# Patient Record
Sex: Male | Born: 1955
Health system: Southern US, Community
[De-identification: ages and names within clinical notes are randomized; demographics above are authoritative.]

## PROBLEM LIST (undated history)

## (undated) DIAGNOSIS — E785 Hyperlipidemia, unspecified: Secondary | ICD-10-CM

## (undated) DIAGNOSIS — S46019A Strain of muscle(s) and tendon(s) of the rotator cuff of unspecified shoulder, initial encounter: Secondary | ICD-10-CM

## (undated) DIAGNOSIS — K219 Gastro-esophageal reflux disease without esophagitis: Secondary | ICD-10-CM

## (undated) DIAGNOSIS — F419 Anxiety disorder, unspecified: Secondary | ICD-10-CM

## (undated) DIAGNOSIS — I10 Essential (primary) hypertension: Secondary | ICD-10-CM

## (undated) DIAGNOSIS — E119 Type 2 diabetes mellitus without complications: Secondary | ICD-10-CM

## (undated) DIAGNOSIS — R011 Cardiac murmur, unspecified: Secondary | ICD-10-CM

## (undated) DIAGNOSIS — N200 Calculus of kidney: Secondary | ICD-10-CM

## (undated) HISTORY — DX: Hyperlipidemia, unspecified: E78.5

## (undated) HISTORY — PX: EYE SURGERY: SHX253

## (undated) HISTORY — PX: BACK SURGERY: SHX140

## (undated) HISTORY — DX: Cardiac murmur, unspecified: R01.1

---

## 2007-06-29 ENCOUNTER — Encounter: Admission: RE | Admit: 2007-06-29 | Discharge: 2007-06-29 | Payer: Self-pay | Admitting: Interventional Cardiology

## 2007-07-06 ENCOUNTER — Inpatient Hospital Stay (HOSPITAL_BASED_OUTPATIENT_CLINIC_OR_DEPARTMENT_OTHER): Admission: RE | Admit: 2007-07-06 | Discharge: 2007-07-06 | Payer: Self-pay | Admitting: Interventional Cardiology

## 2010-08-24 NOTE — Cardiovascular Report (Signed)
NAMETRAEGER, Bobby NO.:  [id number]   MEDICAL RECORD NO.:  [id number]          PATIENT TYPE:  OIB   LOCATION:  1961                         FACILITY:  [facility]   PHYSICIAN:  [name], M.D.   DATE OF BIRTH:  [birth year]   DATE OF PROCEDURE:  07/06/2007  DATE OF DISCHARGE:  07/06/2007                            CARDIAC CATHETERIZATION   INDICATIONS FOR THE PROCEDURE:  [name] had multiple risk factors  including diabetes.  He is 55 years of age.  He has had intermittent  atypical chest pain.  The procedure is being done after a Cardiolite  study demonstrated reversible perfusion defect in the inferior wall.  Findings include the possibility of soft tissue attenuation artifact  versus true coronary disease.   PROCEDURE PERFORMED:  1. Left heart catheterization.  2. Selective coronary angiography.  3. Left ventriculography by hand injection.   DESCRIPTION:  After informed consent, a 4-French sheath was placed in  the right femoral artery using the modified Seldinger technique.  A 4-  [place] A2 multipurpose catheter was then used for hemodynamic  recordings, left ventriculography by hand injection, and right coronary  angiography.  We also used a #4, 4-French left Judkins catheter for left  coronary angiography.  Patient tolerated the procedure without  complications.   RESULTS:  1. Hemodynamic data:      a.     Aortic pressure 126/71.      b.     Left ventricular pressure 128/17.  2. Left ventriculography:  The left ventricle is apparently opacified      by hand injection.  No  significant wall motion abnormality is      noted.  EF is felt to be normal and at least greater than 50%.  3. Coronary angiography.      a.     Left main coronary:  Patent, widely open.      b.     Left anterior descending coronary:  The LAD is large, no       significant obstruction is noted.  The first diagonal branch       contains luminal irregularities, mid LAD contains  luminal       irregularities, no high-grade obstruction is felt to be present.      c.     Circumflex artery:  Circumflex coronary artery is moderate       size vessel predominantly giving off one obtuse marginal branch,       no high-grade obstructive lesions felt to be present.      d.     Right coronary:  The right coronary artery is a dominant       vessel  that gives origin to large PDA and two left ventricular       branches.  No significant obstruction is seen.   CONCLUSIONS:  1. Minimal luminal irregularities in  left anterior descending and      first diagonal, the coronaries,  however, are widely patent without      any significant obstructive lesions.  2. Normal left ventricular function.  3.  Aortic valve calcification  in the left coronary cusp.   PLAN:  No further cardiac evaluation save  for the patient to exercise,  aggressive risk factor modification in this young patient is necessary  to prevent downstream development of significant obstructive disease  and/or acute ischemic syndromes.      [name], M.D.  Electronically Signed     HWS/MEDQ  D:  07/06/2007  T:  07/06/2007  Job:  [id number]   cc:   Holley Bouche, M.D.

## 2015-03-25 ENCOUNTER — Other Ambulatory Visit: Payer: Self-pay | Admitting: Orthopedic Surgery

## 2015-03-26 ENCOUNTER — Encounter (HOSPITAL_BASED_OUTPATIENT_CLINIC_OR_DEPARTMENT_OTHER): Payer: Self-pay | Admitting: *Deleted

## 2015-03-27 ENCOUNTER — Encounter (HOSPITAL_BASED_OUTPATIENT_CLINIC_OR_DEPARTMENT_OTHER)
Admission: RE | Admit: 2015-03-27 | Discharge: 2015-03-27 | Disposition: A | Discharge status: Home / Self Care | Payer: BLUE CROSS/BLUE SHIELD | Source: Ambulatory Visit | Attending: Orthopedic Surgery | Admitting: Orthopedic Surgery

## 2015-03-27 DIAGNOSIS — Z7984 Long term (current) use of oral hypoglycemic drugs: Secondary | ICD-10-CM | POA: Diagnosis not present

## 2015-03-27 DIAGNOSIS — Z6836 Body mass index (BMI) 36.0-36.9, adult: Secondary | ICD-10-CM | POA: Diagnosis not present

## 2015-03-27 DIAGNOSIS — M25511 Pain in right shoulder: Secondary | ICD-10-CM | POA: Diagnosis present

## 2015-03-27 DIAGNOSIS — M65811 Other synovitis and tenosynovitis, right shoulder: Secondary | ICD-10-CM | POA: Diagnosis not present

## 2015-03-27 DIAGNOSIS — Z79899 Other long term (current) drug therapy: Secondary | ICD-10-CM | POA: Diagnosis not present

## 2015-03-27 DIAGNOSIS — K219 Gastro-esophageal reflux disease without esophagitis: Secondary | ICD-10-CM | POA: Diagnosis not present

## 2015-03-27 DIAGNOSIS — E119 Type 2 diabetes mellitus without complications: Secondary | ICD-10-CM | POA: Diagnosis not present

## 2015-03-27 DIAGNOSIS — M7581 Other shoulder lesions, right shoulder: Secondary | ICD-10-CM | POA: Diagnosis not present

## 2015-03-27 DIAGNOSIS — I10 Essential (primary) hypertension: Secondary | ICD-10-CM | POA: Diagnosis not present

## 2015-03-27 DIAGNOSIS — Z87442 Personal history of urinary calculi: Secondary | ICD-10-CM | POA: Diagnosis not present

## 2015-03-27 LAB — BASIC METABOLIC PANEL
ANION GAP: 12 (ref 5–15)
BUN: 12 mg/dL (ref 6–20)
CHLORIDE: 100 mmol/L — AB (ref 101–111)
CO2: 24 mmol/L (ref 22–32)
Calcium: 9.5 mg/dL (ref 8.9–10.3)
Creatinine, Ser: 1.36 mg/dL — ABNORMAL HIGH (ref 0.61–1.24)
GFR calc Af Amer: >60 mL/min (ref >60)
GFR calc non Af Amer: 56 mL/min — ABNORMAL LOW (ref >60)
GLUCOSE: 157 mg/dL — AB (ref 65–99)
POTASSIUM: 4.7 mmol/L (ref 3.5–5.1)
Sodium: 136 mmol/L (ref 135–145)

## 2015-03-27 LAB — GLUCOSE, CAPILLARY: Glucose-Capillary: 149 mg/dL — ABNORMAL HIGH (ref 65–99)

## 2015-03-30 ENCOUNTER — Ambulatory Visit (HOSPITAL_BASED_OUTPATIENT_CLINIC_OR_DEPARTMENT_OTHER): Payer: BLUE CROSS/BLUE SHIELD | Admitting: Anesthesiology

## 2015-03-30 ENCOUNTER — Encounter (HOSPITAL_BASED_OUTPATIENT_CLINIC_OR_DEPARTMENT_OTHER): Payer: Self-pay

## 2015-03-30 ENCOUNTER — Ambulatory Visit (HOSPITAL_BASED_OUTPATIENT_CLINIC_OR_DEPARTMENT_OTHER)
Admission: RE | Admit: 2015-03-30 | Discharge: 2015-03-30 | Disposition: A | Discharge status: Home / Self Care | Payer: BLUE CROSS/BLUE SHIELD | Source: Ambulatory Visit | Attending: Orthopedic Surgery | Admitting: Orthopedic Surgery

## 2015-03-30 ENCOUNTER — Encounter (HOSPITAL_BASED_OUTPATIENT_CLINIC_OR_DEPARTMENT_OTHER)
Admission: RE | Disposition: A | Discharge status: Home / Self Care | Payer: Self-pay | Source: Ambulatory Visit | Attending: Orthopedic Surgery

## 2015-03-30 DIAGNOSIS — K219 Gastro-esophageal reflux disease without esophagitis: Secondary | ICD-10-CM | POA: Insufficient documentation

## 2015-03-30 DIAGNOSIS — M65811 Other synovitis and tenosynovitis, right shoulder: Secondary | ICD-10-CM | POA: Diagnosis not present

## 2015-03-30 DIAGNOSIS — I10 Essential (primary) hypertension: Secondary | ICD-10-CM | POA: Insufficient documentation

## 2015-03-30 DIAGNOSIS — E119 Type 2 diabetes mellitus without complications: Secondary | ICD-10-CM | POA: Insufficient documentation

## 2015-03-30 DIAGNOSIS — Z6836 Body mass index (BMI) 36.0-36.9, adult: Secondary | ICD-10-CM | POA: Insufficient documentation

## 2015-03-30 DIAGNOSIS — M7581 Other shoulder lesions, right shoulder: Secondary | ICD-10-CM | POA: Insufficient documentation

## 2015-03-30 DIAGNOSIS — Z79899 Other long term (current) drug therapy: Secondary | ICD-10-CM | POA: Insufficient documentation

## 2015-03-30 DIAGNOSIS — Z87442 Personal history of urinary calculi: Secondary | ICD-10-CM | POA: Insufficient documentation

## 2015-03-30 DIAGNOSIS — Z7984 Long term (current) use of oral hypoglycemic drugs: Secondary | ICD-10-CM | POA: Insufficient documentation

## 2015-03-30 HISTORY — DX: Essential (primary) hypertension: I10

## 2015-03-30 HISTORY — PX: SHOULDER ARTHROSCOPY WITH SUBACROMIAL DECOMPRESSION: SHX5684

## 2015-03-30 HISTORY — DX: Type 2 diabetes mellitus without complications: E11.9

## 2015-03-30 HISTORY — DX: Calculus of kidney: N20.0

## 2015-03-30 HISTORY — DX: Anxiety disorder, unspecified: F41.9

## 2015-03-30 HISTORY — DX: Strain of muscle(s) and tendon(s) of the rotator cuff of unspecified shoulder, initial encounter: S46.019A

## 2015-03-30 HISTORY — DX: Gastro-esophageal reflux disease without esophagitis: K21.9

## 2015-03-30 LAB — GLUCOSE, CAPILLARY
GLUCOSE-CAPILLARY: 157 mg/dL — AB (ref 65–99)
GLUCOSE-CAPILLARY: 175 mg/dL — AB (ref 65–99)

## 2015-03-30 SURGERY — SHOULDER ARTHROSCOPY WITH SUBACROMIAL DECOMPRESSION
Anesthesia: General | Site: Shoulder | Laterality: Right

## 2015-03-30 MED ORDER — LACTATED RINGERS IV SOLN
INTRAVENOUS | Status: DC
  Administered 2015-03-30 (×3): via INTRAVENOUS

## 2015-03-30 MED ORDER — POVIDONE-IODINE 7.5 % EX SOLN: Freq: Once | CUTANEOUS | Status: DC

## 2015-03-30 MED ORDER — PROPOFOL 10 MG/ML IV BOLUS
INTRAVENOUS | Status: DC | PRN
  Administered 2015-03-30: 300 mg via INTRAVENOUS

## 2015-03-30 MED ORDER — CEFAZOLIN SODIUM 1-5 GM-% IV SOLN
INTRAVENOUS | Status: AC
  Filled 2015-03-30: qty 50

## 2015-03-30 MED ORDER — KETOROLAC TROMETHAMINE 30 MG/ML IJ SOLN
30.0000 mg | Freq: Once | INTRAMUSCULAR | Status: AC
  Administered 2015-03-30: 30 mg via INTRAVENOUS

## 2015-03-30 MED ORDER — CEFAZOLIN SODIUM-DEXTROSE 2-3 GM-% IV SOLR
2.0000 g | INTRAVENOUS | Status: AC
  Administered 2015-03-30: 3 g via INTRAVENOUS

## 2015-03-30 MED ORDER — SCOPOLAMINE 1 MG/3DAYS TD PT72: 1.0000 | MEDICATED_PATCH | Freq: Once | TRANSDERMAL | Status: DC | PRN

## 2015-03-30 MED ORDER — OXYCODONE HCL 5 MG PO TABS
5.0000 mg | ORAL_TABLET | Freq: Once | ORAL | Status: AC | PRN
  Administered 2015-03-30: 5 mg via ORAL

## 2015-03-30 MED ORDER — ONDANSETRON HCL 4 MG/2ML IJ SOLN
INTRAMUSCULAR | Status: DC | PRN
  Administered 2015-03-30: 4 mg via INTRAVENOUS

## 2015-03-30 MED ORDER — MIDAZOLAM HCL 2 MG/2ML IJ SOLN
INTRAMUSCULAR | Status: AC
  Filled 2015-03-30: qty 2

## 2015-03-30 MED ORDER — FENTANYL CITRATE (PF) 100 MCG/2ML IJ SOLN
INTRAMUSCULAR | Status: AC
  Filled 2015-03-30: qty 2

## 2015-03-30 MED ORDER — CEFAZOLIN SODIUM-DEXTROSE 2-3 GM-% IV SOLR
INTRAVENOUS | Status: AC
  Filled 2015-03-30: qty 100

## 2015-03-30 MED ORDER — GLYCOPYRROLATE 0.2 MG/ML IJ SOLN: 0.2000 mg | Freq: Once | INTRAMUSCULAR | Status: DC | PRN

## 2015-03-30 MED ORDER — HYDROMORPHONE HCL 1 MG/ML IJ SOLN
INTRAMUSCULAR | Status: AC
  Filled 2015-03-30: qty 1

## 2015-03-30 MED ORDER — OXYCODONE HCL 5 MG/5ML PO SOLN: 5.0000 mg | Freq: Once | ORAL | Status: AC | PRN

## 2015-03-30 MED ORDER — DEXAMETHASONE SODIUM PHOSPHATE 4 MG/ML IJ SOLN
INTRAMUSCULAR | Status: DC | PRN
  Administered 2015-03-30: 10 mg via INTRAVENOUS

## 2015-03-30 MED ORDER — DOCUSATE SODIUM 100 MG PO CAPS: 100.0000 mg | ORAL_CAPSULE | Freq: Three times a day (TID) | ORAL | Status: DC | PRN

## 2015-03-30 MED ORDER — FENTANYL CITRATE (PF) 100 MCG/2ML IJ SOLN
50.0000 ug | INTRAMUSCULAR | Status: AC | PRN
  Administered 2015-03-30 (×3): 100 ug via INTRAVENOUS

## 2015-03-30 MED ORDER — HYDROMORPHONE HCL 1 MG/ML IJ SOLN
0.5000 mg | INTRAMUSCULAR | Status: DC | PRN
  Administered 2015-03-30 (×2): 0.5 mg via INTRAVENOUS

## 2015-03-30 MED ORDER — SUCCINYLCHOLINE CHLORIDE 20 MG/ML IJ SOLN
INTRAMUSCULAR | Status: DC | PRN
  Administered 2015-03-30: 120 mg via INTRAVENOUS

## 2015-03-30 MED ORDER — OXYCODONE HCL 5 MG PO TABS
ORAL_TABLET | ORAL | Status: AC
  Filled 2015-03-30: qty 1

## 2015-03-30 MED ORDER — SODIUM CHLORIDE 0.9 % IR SOLN
Status: DC | PRN
  Administered 2015-03-30: 3100 mL

## 2015-03-30 MED ORDER — TRIAMCINOLONE ACETONIDE 40 MG/ML IJ SUSP
INTRAMUSCULAR | Status: DC | PRN
Start: 2015-03-30 — End: 2015-03-30
  Administered 2015-03-30: 40 mg via INTRAMUSCULAR

## 2015-03-30 MED ORDER — OXYCODONE-ACETAMINOPHEN 5-325 MG PO TABS: 1.0000 | ORAL_TABLET | ORAL | Status: DC | PRN

## 2015-03-30 MED ORDER — HYDROMORPHONE HCL 1 MG/ML IJ SOLN
0.2500 mg | INTRAMUSCULAR | Status: DC | PRN
  Administered 2015-03-30 (×4): 0.5 mg via INTRAVENOUS

## 2015-03-30 MED ORDER — HYDROMORPHONE HCL 1 MG/ML IJ SOLN
0.5000 mg | INTRAMUSCULAR | Status: AC | PRN
  Administered 2015-03-30 (×4): 0.5 mg via INTRAVENOUS

## 2015-03-30 MED ORDER — TRIAMCINOLONE ACETONIDE 40 MG/ML IJ SUSP
INTRAMUSCULAR | Status: AC
  Filled 2015-03-30: qty 5

## 2015-03-30 MED ORDER — MIDAZOLAM HCL 2 MG/2ML IJ SOLN
1.0000 mg | INTRAMUSCULAR | Status: DC | PRN
  Administered 2015-03-30: 2 mg via INTRAVENOUS

## 2015-03-30 MED ORDER — BUPIVACAINE HCL (PF) 0.25 % IJ SOLN
INTRAMUSCULAR | Status: DC | PRN
  Administered 2015-03-30: 7 mL

## 2015-03-30 MED ORDER — LIDOCAINE HCL (CARDIAC) 20 MG/ML IV SOLN
INTRAVENOUS | Status: DC | PRN
  Administered 2015-03-30: 50 mg via INTRAVENOUS

## 2015-03-30 MED ORDER — BUPIVACAINE-EPINEPHRINE (PF) 0.5% -1:200000 IJ SOLN
INTRAMUSCULAR | Status: DC | PRN
  Administered 2015-03-30: 25 mL via PERINEURAL

## 2015-03-30 MED ORDER — MEPERIDINE HCL 25 MG/ML IJ SOLN: 6.2500 mg | INTRAMUSCULAR | Status: DC | PRN

## 2015-03-30 MED ORDER — KETOROLAC TROMETHAMINE 30 MG/ML IJ SOLN
INTRAMUSCULAR | Status: AC
  Filled 2015-03-30: qty 1

## 2015-03-30 SURGICAL SUPPLY — 79 items
APL SKNCLS STERI-STRIP NONHPOA (GAUZE/BANDAGES/DRESSINGS)
BENZOIN TINCTURE PRP APPL 2/3 (GAUZE/BANDAGES/DRESSINGS) IMPLANT
BLADE CLIPPER SURG (BLADE) IMPLANT
BLADE SURG 15 STRL LF DISP TIS (BLADE) IMPLANT
BLADE SURG 15 STRL SS (BLADE)
BUR OVAL 4.0 (BURR) ×3 IMPLANT
CANNULA 5.75X71 LONG (CANNULA) ×3 IMPLANT
CANNULA TWIST IN 8.25X7CM (CANNULA) IMPLANT
CHLORAPREP W/TINT 26ML (MISCELLANEOUS) ×3 IMPLANT
DECANTER SPIKE VIAL GLASS SM (MISCELLANEOUS) IMPLANT
DRAPE INCISE IOBAN 66X45 STRL (DRAPES) ×3 IMPLANT
DRAPE STERI 35X30 U-POUCH (DRAPES) ×3 IMPLANT
DRAPE SURG 17X23 STRL (DRAPES) ×3 IMPLANT
DRAPE U 20/CS (DRAPES) ×3 IMPLANT
DRAPE U-SHAPE 47X51 STRL (DRAPES) ×3 IMPLANT
DRAPE U-SHAPE 76X120 STRL (DRAPES) ×6 IMPLANT
DRSG PAD ABDOMINAL 8X10 ST (GAUZE/BANDAGES/DRESSINGS) ×3 IMPLANT
ELECT REM PT RETURN 9FT ADLT (ELECTROSURGICAL) ×3
ELECTRODE REM PT RTRN 9FT ADLT (ELECTROSURGICAL) ×2 IMPLANT
GAUZE SPONGE 4X4 12PLY STRL (GAUZE/BANDAGES/DRESSINGS) ×3 IMPLANT
GAUZE SPONGE 4X4 16PLY XRAY LF (GAUZE/BANDAGES/DRESSINGS) IMPLANT
GAUZE XEROFORM 1X8 LF (GAUZE/BANDAGES/DRESSINGS) ×3 IMPLANT
GLOVE BIO SURGEON STRL SZ 6.5 (GLOVE) ×4 IMPLANT
GLOVE BIO SURGEON STRL SZ7 (GLOVE) ×3 IMPLANT
GLOVE BIO SURGEON STRL SZ7.5 (GLOVE) ×3 IMPLANT
GLOVE BIOGEL PI IND STRL 7.0 (GLOVE) ×2 IMPLANT
GLOVE BIOGEL PI IND STRL 8 (GLOVE) ×2 IMPLANT
GLOVE BIOGEL PI INDICATOR 7.0 (GLOVE) ×1
GLOVE BIOGEL PI INDICATOR 8 (GLOVE) ×1
GOWN STRL REUS W/ TWL LRG LVL3 (GOWN DISPOSABLE) ×4 IMPLANT
GOWN STRL REUS W/ TWL XL LVL3 (GOWN DISPOSABLE) ×2 IMPLANT
GOWN STRL REUS W/TWL LRG LVL3 (GOWN DISPOSABLE) ×6
GOWN STRL REUS W/TWL XL LVL3 (GOWN DISPOSABLE) ×3
LASSO CRESCENT QUICKPASS (SUTURE) IMPLANT
LIQUID BAND (GAUZE/BANDAGES/DRESSINGS) IMPLANT
MANIFOLD NEPTUNE II (INSTRUMENTS) ×3 IMPLANT
NDL 1/2 CIR CATGUT .05X1.09 (NEEDLE) IMPLANT
NDL SCORPION MULTI FIRE (NEEDLE) IMPLANT
NDL SUT 6 .5 CRC .975X.05 MAYO (NEEDLE) IMPLANT
NEEDLE 1/2 CIR CATGUT .05X1.09 (NEEDLE) IMPLANT
NEEDLE MAYO TAPER (NEEDLE)
NEEDLE SCORPION MULTI FIRE (NEEDLE) IMPLANT
NS IRRIG 1000ML POUR BTL (IV SOLUTION) IMPLANT
PACK ARTHROSCOPY DSU (CUSTOM PROCEDURE TRAY) ×3 IMPLANT
PACK BASIN DAY SURGERY FS (CUSTOM PROCEDURE TRAY) ×3 IMPLANT
PENCIL BUTTON HOLSTER BLD 10FT (ELECTRODE) IMPLANT
RESECTOR FULL RADIUS 4.2MM (BLADE) ×3 IMPLANT
SLEEVE SCD COMPRESS KNEE MED (MISCELLANEOUS) ×3 IMPLANT
SLING ARM FOAM STRAP LRG (SOFTGOODS) IMPLANT
SLING ARM FOAM STRAP XLG (SOFTGOODS) ×2 IMPLANT
SLING ARM IMMOBILIZER MED (SOFTGOODS) IMPLANT
SLING ARM MED ADULT FOAM STRAP (SOFTGOODS) IMPLANT
SLING ARM XL FOAM STRAP (SOFTGOODS) IMPLANT
SPONGE LAP 4X18 X RAY DECT (DISPOSABLE) IMPLANT
STRIP CLOSURE SKIN 1/2X4 (GAUZE/BANDAGES/DRESSINGS) IMPLANT
SUCTION FRAZIER TIP 10 FR DISP (SUCTIONS) IMPLANT
SUPPORT WRAP ARM LG (MISCELLANEOUS) IMPLANT
SUT BONE WAX W31G (SUTURE) IMPLANT
SUT ETHILON 3 0 PS 1 (SUTURE) ×3 IMPLANT
SUT FIBERWIRE #2 38 T-5 BLUE (SUTURE)
SUT MNCRL AB 3-0 PS2 18 (SUTURE) IMPLANT
SUT MNCRL AB 4-0 PS2 18 (SUTURE) IMPLANT
SUT PDS AB 0 CT 36 (SUTURE) IMPLANT
SUT PROLENE 3 0 PS 2 (SUTURE) IMPLANT
SUT TIGER TAPE 7 IN WHITE (SUTURE) IMPLANT
SUT VIC AB 0 CT1 27 (SUTURE)
SUT VIC AB 0 CT1 27XBRD ANBCTR (SUTURE) IMPLANT
SUT VIC AB 2-0 SH 27 (SUTURE)
SUT VIC AB 2-0 SH 27XBRD (SUTURE) IMPLANT
SUTURE FIBERWR #2 38 T-5 BLUE (SUTURE) IMPLANT
SYR BULB 3OZ (MISCELLANEOUS) IMPLANT
TAPE FIBER 2MM 7IN #2 BLUE (SUTURE) IMPLANT
TOWEL OR 17X24 6PK STRL BLUE (TOWEL DISPOSABLE) ×3 IMPLANT
TOWEL OR NON WOVEN STRL DISP B (DISPOSABLE) ×3 IMPLANT
TUBE CONNECTING 20X1/4 (TUBING) ×3 IMPLANT
TUBING ARTHROSCOPY IRRIG 16FT (MISCELLANEOUS) ×3 IMPLANT
WAND STAR VAC 90 (SURGICAL WAND) ×3 IMPLANT
WATER STERILE IRR 1000ML POUR (IV SOLUTION) ×3 IMPLANT
YANKAUER SUCT BULB TIP NO VENT (SUCTIONS) IMPLANT

## 2015-03-30 NOTE — Anesthesia Procedure Notes (Addendum)
Anesthesia Regional Block:  Interscalene brachial plexus block  Pre-Anesthetic Checklist: ,, timeout performed, Correct Patient, Correct Site, Correct Laterality, Correct Procedure, Correct Position, site marked, Risks and benefits discussed,  Surgical consent,  Pre-op evaluation,  At surgeon's request and post-op pain management  Laterality: Right and Upper  Prep: chloraprep       Needles:  Injection technique: Single-shot  Needle Type: Echogenic Needle     Needle Length: 5cm 5 cm Needle Gauge: 21 and 21 G    Additional Needles:  Procedures: ultrasound guided (picture in chart) Interscalene brachial plexus block Narrative:  Start time: 03/30/2015 11:02 AM End time: 03/30/2015 11:07 AM Injection made incrementally with aspirations every 5 mL.  Performed by: Personally  Anesthesiologist: CREWS, DAVID   Procedure Name: Intubation Date/Time: 03/30/2015 11:59 AM Performed by: Caren MacadamARTER, Sandrina Heaton W Pre-anesthesia Checklist: Patient identified, Emergency Drugs available, Suction available and Patient being monitored Patient Re-evaluated:Patient Re-evaluated prior to inductionOxygen Delivery Method: Circle System Utilized Preoxygenation: Pre-oxygenation with 100% oxygen Intubation Type: IV induction Ventilation: Mask ventilation without difficulty and Two handed mask ventilation required Laryngoscope size: glide scope. Tube type: Oral Tube size: 8.0 mm Number of attempts: 1 Airway Equipment and Method: Stylet and Video-laryngoscopy Placement Confirmation: ETT inserted through vocal cords under direct vision,  positive ETCO2 and breath sounds checked- equal and bilateral Secured at: 24 cm Tube secured with: Tape Dental Injury: Teeth and Oropharynx as per pre-operative assessment  Difficulty Due To: Difficulty was anticipated, Difficult Airway- due to large tongue, Difficult Airway- due to reduced neck mobility and Difficult Airway- due to limited oral opening Future  Recommendations: Recommend- induction with short-acting agent, and alternative techniques readily available Comments: Elective glide scope based on airway assessment. Good view, passed ett with rigid stylet on first attempt. Equal BBS, Pos eTCO2.

## 2015-03-30 NOTE — Transfer of Care (Signed)
Immediate Anesthesia Transfer of Care Note  Patient: Bobby Torres  Procedure(s) Performed: Procedure(s) with comments: SHOULDER ARTHROSCOPY WITH ROTATOR CUFF REPAIR VS DEBRIDEMENT, SUBACROMIAL DECOMPRESSION, POSSIBLE CAPSULAR RELEASE (Right) - Right shoulder arthroscopy rotator cuff repair vs debridement, subacromial decompression, possible capsular release  Patient Location: PACU  Anesthesia Type:General and GA combined with regional for post-op pain  Level of Consciousness: awake  Airway & Oxygen Therapy: Patient Spontanous Breathing and Patient connected to face mask oxygen  Post-op Assessment: Report given to RN and Post -op Vital signs reviewed and stable  Post vital signs: Reviewed and stable  Last Vitals:  Filed Vitals:   03/30/15 1259 03/30/15 1300  BP:    Pulse: 89 88  Temp:    Resp: 28 19    Complications: No apparent anesthesia complications

## 2015-03-30 NOTE — Op Note (Signed)
Procedure(s):   Bobby Torres male 59 y.o. 03/30/2015  Procedure(s) and Anesthesia Type: #1 right shoulder arthroscopic lysis of adhesions/capsular release with extensive synovectomy #2 right shoulder arthroscopic subacromial decompression    Surgeon(s) and Role:    * Bobby Broom, MD - Primary     Surgeon: Bobby Torres   Assistants: Bobby Lack PA-C (Danielle was present and scrubbed throughout the procedure and was essential in positioning, assisting with the camera and instrumentation,, and closure)  Anesthesia: General endotracheal anesthesia with preoperative interscalene block given by the attending anesthesiologist    Procedure Detail    Estimated Blood Loss: Min         Drains: none  Blood Given: none         Specimens: none        Complications:  * No complications entered in OR log *         Disposition: PACU - hemodynamically stable.         Condition: stable    Procedure:   INDICATIONS FOR SURGERY: The patient is 59 y.o. male who has had a long history of right shoulder pain and progressive stiffness which is failed extensive conservative management. He had an MRI which revealed some bursal sided fraying of the rotator cuff but no full-thickness tear. Ultimately he was indicated for surgical treatment to try and decrease pain and restore function.  OPERATIVE FINDINGS: He was noted to have significant stiffness under anesthesia with forward flexion limited to 100, external rotation to 10, abduction to 40, and in this position internal and external rotation to less than 10. At the conclusion of the procedure he was able to come to about 170 forward flexion, external rotation to 45, abduction to 90 in this position 90 of external rotation and 60 of internal rotation.   DESCRIPTION OF PROCEDURE: The patient was identified in preoperative  holding area where I personally marked the operative site after  verifying site, side, and  procedure with the patient. An interscalene block was given by the attending anesthesiologist the holding area.  The patient was taken back to the operating room where general anesthesia was induced without complication and was placed in the beach-chair position with the back  elevated about 60 degrees and all extremities and head and neck carefully padded and  positioned.   The right upper extremity was then prepped and  draped in a standard sterile fashion. The appropriate time-out  procedure was carried out. The patient did receive IV antibiotics  within 30 minutes of incision.   A small posterior portal incision was made and the arthroscope was introduced into the joint. An anterior portal was then established above the subscapularis using needle localization. Small cannula was placed anteriorly. Diagnostic arthroscopy was then carried out   He was noted to have significant synovitis in the joint. There is some fraying of the superior labrum which was debrided with shaver. The synovitis was addressed with ArthroCare. He was noted to have very thickened tissue in the rotator interval as expected. The rotator interval was released with the ArthriCare back to the level of the posterior coracoid. The anterior capsule was then released down through the middle glenohumeral ligament down to about the 5:00 position inferiorly. The camera was then moved anteriorly and the posterior capsule was addressed with the ArthriCare releasing the posterior capsule under direct visualization.   The arthroscope was then introduced into the subacromial space a standard lateral portal was established with needle localization. The shaver was  used through the lateral portal to perform extensive bursectomy. Coracoacromial ligament was examined and found to be severely frayed indicating chronic impingement.  The rotator cuff was carefully examined and found to have some fraying anteriorly but no partial tears.  The  coracoacromial ligament was taken down off the anterior acromion with the ArthroCare exposing a moderate hooked anterior acromial spur. A high-speed bur was then used through the lateral portal to take down the anterior acromial spur from lateral to medial in a standard acromioplasty.  The acromioplasty was also viewed from the lateral portal and the bur was used as necessary to ensure that the acromion was completely flat from posterior to anterior.  At this point yet scopic equipment was removed and a manipulation under anesthesia was carried out with satisfactory lysis of adhesions and regaining of motion as described above. The camera was then placed back in the glenohumeral joint from posterior and an 18-gauge spinal needle was placed through the anterior portal and visualized to be in the glenohumeral joint. The camera was then removed. Through this needle a neck your of 1 mL 40 mg Kenalog and 7 mL quarter percent Marcaine without epinephrine was introduced. The needle was then removed and portals were closed with 3-0 nylon in an interrupted fashion. Sterile dressings were then applied including Xeroform 4 x 4's ABDs and tape. The patient was then allowed to awaken from general anesthesia, placed in a sling, transferred to the stretcher and taken to the recovery room in stable condition.   POSTOPERATIVE PLAN: The patient will be discharged home today and will followup in one week for suture removal and wound check.  We will get him into therapy right away.

## 2015-03-30 NOTE — Anesthesia Postprocedure Evaluation (Signed)
Anesthesia Post Note  Patient: Bobby SmilingLarry G Torres  Procedure(s) Performed: Procedure(s) (LRB): SHOULDER ARTHROSCOPY WITH SUBACROMIAL DECOMPRESSION Debridment and capsular release (Right)  Patient location during evaluation: PACU Anesthesia Type: General Level of consciousness: awake and alert Pain management: pain level controlled Vital Signs Assessment: post-procedure vital signs reviewed and stable Respiratory status: spontaneous breathing, nonlabored ventilation and respiratory function stable Cardiovascular status: blood pressure returned to baseline and stable Postop Assessment: no signs of nausea or vomiting Anesthetic complications: no    Last Vitals:  Filed Vitals:   03/30/15 1445 03/30/15 1500  BP: 170/85 168/92  Pulse: 82 82  Temp:    Resp: 14 10    Last Pain:  Filed Vitals:   03/30/15 1505  PainSc: 3                  Rosio Weiss A

## 2015-03-30 NOTE — Progress Notes (Signed)
Assisted Dr. Crews with right, ultrasound guided, interscalene  block. Side rails up, monitors on throughout procedure. See vital signs in flow sheet. Tolerated Procedure well. 

## 2015-03-30 NOTE — H&P (Signed)
Marcelle SmilingLarry G Jefferys is an 59 y.o. male.   Chief Complaint: R shoulder pain and stiffness HPI: R shoulder pain and stiffness with partial thickness RCT, failed conservative treatment.  Past Medical History  Diagnosis Date  . Hypertension   . Diabetes mellitus without complication (HCC)   . Anxiety   . GERD (gastroesophageal reflux disease)   . Rotator cuff strain   . Kidney stones     Past Surgical History  Procedure Laterality Date  . Back surgery      x 2  . Eye surgery      History reviewed. No pertinent family history. Social History:  reports that he has never smoked. He has never used smokeless tobacco. He reports that he drinks alcohol. He reports that he does not use illicit drugs.  Allergies: No Known Allergies  Medications Prior to Admission  Medication Sig Dispense Refill  . atorvastatin (LIPITOR) 40 MG tablet Take 40 mg by mouth daily.    . clonazePAM (KLONOPIN) 1 MG tablet Take 1 mg by mouth at bedtime.    . empagliflozin (JARDIANCE) 10 MG TABS tablet Take 10 mg by mouth daily.    Marland Kitchen. glipiZIDE (GLUCOTROL) 10 MG tablet Take 10 mg by mouth 2 (two) times daily before a meal.    . lisinopril (PRINIVIL,ZESTRIL) 20 MG tablet Take 20 mg by mouth daily.    . metFORMIN (GLUCOPHAGE) 500 MG tablet Take 1,000 mg by mouth 2 (two) times daily with a meal.    . pantoprazole (PROTONIX) 40 MG tablet Take 40 mg by mouth daily.    . pioglitazone (ACTOS) 30 MG tablet Take 30 mg by mouth daily.      Results for orders placed or performed during the hospital encounter of 03/30/15 (from the past 48 hour(s))  Glucose, capillary     Status: Abnormal   Collection Time: 03/30/15 10:45 AM  Result Value Ref Range   Glucose-Capillary 157 (H) 65 - 99 mg/dL   No results found.  Review of Systems  All other systems reviewed and are negative.   Blood pressure 153/64, pulse 88, temperature 98.4 F (36.9 C), temperature source Oral, resp. rate 19, height 6\' 1"  (1.854 m), weight 125.193 kg (276  lb), SpO2 99 %. Physical Exam  Constitutional: He is oriented to person, place, and time. He appears well-developed and well-nourished.  HENT:  Head: Atraumatic.  Eyes: EOM are normal.  Cardiovascular: Intact distal pulses.   Respiratory: Effort normal.  Musculoskeletal:  R shoulder pain with limited motion.  Neurological: He is alert and oriented to person, place, and time.  Skin: Skin is warm and dry.  Psychiatric: He has a normal mood and affect.     Assessment/Plan R shoulder partial RCT, stiffness Plan R arth debridement vs repair RCT, EUA, possible MUA/CR Risks / benefits of surgery discussed Consent on chart  NPO for OR Preop antibiotics   Jun Osment WILLIAM 03/30/2015, 11:36 AM

## 2015-03-30 NOTE — Anesthesia Preprocedure Evaluation (Addendum)
Anesthesia Evaluation  Patient identified by MRN, date of birth, ID band Patient awake    Reviewed: Allergy & Precautions, NPO status , Patient's Chart, lab work & pertinent test results  Airway Mallampati: II  TM Distance: >3 FB Neck ROM: Full    Dental  (+) Teeth Intact, Dental Advisory Given   Pulmonary    breath sounds clear to auscultation       Cardiovascular hypertension, Pt. on medications  Rhythm:Regular Rate:Normal     Neuro/Psych    GI/Hepatic   Endo/Other  diabetes, Well Controlled, Type 2, Oral Hypoglycemic AgentsMorbid obesity  Renal/GU      Musculoskeletal   Abdominal   Peds  Hematology   Anesthesia Other Findings   Reproductive/Obstetrics                            Anesthesia Physical Anesthesia Plan  ASA: III  Anesthesia Plan: General   Post-op Pain Management: MAC Combined w/ Regional for Post-op pain   Induction: Intravenous  Airway Management Planned: Oral ETT  Additional Equipment:   Intra-op Plan:   Post-operative Plan: Extubation in OR  Informed Consent: I have reviewed the patients History and Physical, chart, labs and discussed the procedure including the risks, benefits and alternatives for the proposed anesthesia with the patient or authorized representative who has indicated his/her understanding and acceptance.   Dental advisory given  Plan Discussed with: CRNA, Anesthesiologist and Surgeon  Anesthesia Plan Comments:         Anesthesia Quick Evaluation

## 2015-03-30 NOTE — Discharge Instructions (Signed)
Discharge Instructions after Arthroscopic Shoulder Surgery ° ° °A sling has been provided for you. You may remove the sling after 72 hours. The sling may be worn for your protection, if you are in a crowd.  °Use ice on the shoulder intermittently over the first 48 hours after surgery.  °Pain medication has been prescribed for you.  °Use your medication liberally over the first 48 hours, and then begin to taper your use. You may take Extra Strength Tylenol or Tylenol only in place of the pain pills. DO NOT take ANY nonsteroidal anti-inflammatory pain medications: Advil, Motrin, Ibuprofen, Aleve, Naproxen, or Naprosyn.  °You may remove your dressing after two days.  °You may shower 5 days after surgery. The incision CANNOT get wet prior to 5 days. Simply allow the water to wash over the site and then pat dry. Do not rub the incision. Make sure your axilla (armpit) is completely dry after showering.  °Take one aspirin a day for 2 weeks after surgery, unless you have an aspirin sensitivity/allergy or asthma.  °Three to 5 times each day you should perform assisted overhead reaching and external rotation (outward turning) exercises with the operative arm. Both exercises should be done with the non-operative arm used as the "therapist arm" while the operative arm remains relaxed. Ten of each exercise should be done three to five times each day. ° ° ° °Overhead reach is helping to lift your stiff arm up as high as it will go. To stretch your overhead reach, lie flat on your back, relax, and grasp the wrist of the tight shoulder with your opposite hand. Using the power in your opposite arm, bring the stiff arm up as far as it is comfortable. Start holding it for ten seconds and then work up to where you can hold it for a count of 30. Breathe slowly and deeply while the arm is moved. Repeat this stretch ten times, trying to help the arm up a little higher each time.  ° ° ° ° ° °External rotation is turning the arm out to  the side while your elbow stays close to your body. External rotation is best stretched while you are lying on your back. Hold a cane, yardstick, broom handle, or dowel in both hands. Bend both elbows to a right angle. Use steady, gentle force from your normal arm to rotate the hand of the stiff shoulder out away from your body. Continue the rotation as far as it will go comfortably, holding it there for a count of 10. Repeat this exercise ten times.  ° ° ° °Please call [phone] during normal business hours or [phone] after hours for any problems. Including the following: ° °- excessive redness of the incisions °- drainage for more than 4 days °- fever of more than 101.5 F ° °*Please note that pain medications will not be refilled after hours or on weekends. ° ° °Regional Anesthesia Blocks ° °1. Numbness or the inability to move the "blocked" extremity may last from 3-48 hours after placement. The length of time depends on the medication injected and your individual response to the medication. If the numbness is not going away after 48 hours, call your surgeon. ° °2. The extremity that is blocked will need to be protected until the numbness is gone and the  Strength has returned. Because you cannot feel it, you will need to take extra care to avoid injury. Because it may be weak, you may have difficulty moving it or using   it. You may not know what position it is in without looking at it while the block is in effect. ° °3. For blocks in the legs and feet, returning to weight bearing and walking needs to be done carefully. You will need to wait until the numbness is entirely gone and the strength has returned. You should be able to move your leg and foot normally before you try and bear weight or walk. You will need someone to be with you when you first try to ensure you do not fall and possibly risk injury. ° °4. Bruising and tenderness at the needle site are common side effects and will resolve in a few  days. ° °5. Persistent numbness or new problems with movement should be communicated to the surgeon or the [facility] ([phone])/ [facility] ([phone]). ° ° °Post Anesthesia Home Care Instructions ° °Activity: °Get plenty of rest for the remainder of the day. A responsible adult should stay with you for 24 hours following the procedure.  °For the next 24 hours, DO NOT: °-Drive a car °-Operate machinery °-Drink alcoholic beverages °-Take any medication unless instructed by your physician °-Make any legal decisions or sign important papers. ° °Meals: °Start with liquid foods such as gelatin or soup. Progress to regular foods as tolerated. Avoid greasy, spicy, heavy foods. If nausea and/or vomiting occur, drink only clear liquids until the nausea and/or vomiting subsides. Call your physician if vomiting continues. ° °Special Instructions/Symptoms: °Your throat may feel dry or sore from the anesthesia or the breathing tube placed in your throat during surgery. If this causes discomfort, gargle with warm salt water. The discomfort should disappear within 24 hours. ° °If you had a scopolamine patch placed behind your ear for the management of post- operative nausea and/or vomiting: ° °1. The medication in the patch is effective for 72 hours, after which it should be removed.  Wrap patch in a tissue and discard in the trash. Wash hands thoroughly with soap and water. °2. You may remove the patch earlier than 72 hours if you experience unpleasant side effects which may include dry mouth, dizziness or visual disturbances. °3. Avoid touching the patch. Wash your hands with soap and water after contact with the patch. °  ° °

## 2015-03-31 ENCOUNTER — Encounter (HOSPITAL_BASED_OUTPATIENT_CLINIC_OR_DEPARTMENT_OTHER): Payer: Self-pay | Admitting: Orthopedic Surgery

## 2015-09-14 DIAGNOSIS — L03116 Cellulitis of left lower limb: Secondary | ICD-10-CM | POA: Diagnosis not present

## 2015-10-26 DIAGNOSIS — E291 Testicular hypofunction: Secondary | ICD-10-CM | POA: Diagnosis not present

## 2015-10-26 DIAGNOSIS — E119 Type 2 diabetes mellitus without complications: Secondary | ICD-10-CM | POA: Diagnosis not present

## 2015-10-26 DIAGNOSIS — E7800 Pure hypercholesterolemia, unspecified: Secondary | ICD-10-CM | POA: Diagnosis not present

## 2015-10-26 DIAGNOSIS — K219 Gastro-esophageal reflux disease without esophagitis: Secondary | ICD-10-CM | POA: Diagnosis not present

## 2015-10-26 DIAGNOSIS — I10 Essential (primary) hypertension: Secondary | ICD-10-CM | POA: Diagnosis not present

## 2015-11-17 DIAGNOSIS — E119 Type 2 diabetes mellitus without complications: Secondary | ICD-10-CM | POA: Diagnosis not present

## 2015-11-17 DIAGNOSIS — H2513 Age-related nuclear cataract, bilateral: Secondary | ICD-10-CM | POA: Diagnosis not present

## 2015-11-17 DIAGNOSIS — Z7984 Long term (current) use of oral hypoglycemic drugs: Secondary | ICD-10-CM | POA: Diagnosis not present

## 2015-11-23 DIAGNOSIS — E291 Testicular hypofunction: Secondary | ICD-10-CM | POA: Diagnosis not present

## 2015-12-21 DIAGNOSIS — E291 Testicular hypofunction: Secondary | ICD-10-CM | POA: Diagnosis not present

## 2016-01-18 DIAGNOSIS — E291 Testicular hypofunction: Secondary | ICD-10-CM | POA: Diagnosis not present

## 2016-02-04 DIAGNOSIS — E291 Testicular hypofunction: Secondary | ICD-10-CM | POA: Diagnosis not present

## 2016-03-08 DIAGNOSIS — E291 Testicular hypofunction: Secondary | ICD-10-CM | POA: Diagnosis not present

## 2016-03-30 ENCOUNTER — Encounter (HOSPITAL_COMMUNITY): Payer: Self-pay | Admitting: Emergency Medicine

## 2016-03-30 ENCOUNTER — Emergency Department (HOSPITAL_COMMUNITY): Payer: BLUE CROSS/BLUE SHIELD

## 2016-03-30 ENCOUNTER — Emergency Department (HOSPITAL_COMMUNITY)
Admission: EM | Admit: 2016-03-30 | Discharge: 2016-03-30 | Disposition: A | Discharge status: Home / Self Care | Payer: BLUE CROSS/BLUE SHIELD | Attending: Emergency Medicine | Admitting: Emergency Medicine

## 2016-03-30 DIAGNOSIS — Z7984 Long term (current) use of oral hypoglycemic drugs: Secondary | ICD-10-CM | POA: Insufficient documentation

## 2016-03-30 DIAGNOSIS — E119 Type 2 diabetes mellitus without complications: Secondary | ICD-10-CM | POA: Diagnosis not present

## 2016-03-30 DIAGNOSIS — R0602 Shortness of breath: Secondary | ICD-10-CM | POA: Insufficient documentation

## 2016-03-30 DIAGNOSIS — I10 Essential (primary) hypertension: Secondary | ICD-10-CM | POA: Diagnosis not present

## 2016-03-30 DIAGNOSIS — R0789 Other chest pain: Secondary | ICD-10-CM | POA: Diagnosis not present

## 2016-03-30 DIAGNOSIS — R069 Unspecified abnormalities of breathing: Secondary | ICD-10-CM | POA: Diagnosis not present

## 2016-03-30 LAB — CBC WITH DIFFERENTIAL/PLATELET
Basophils Absolute: 0 10*3/uL (ref 0.0–0.1)
Basophils Relative: 0 %
EOS ABS: 0 10*3/uL (ref 0.0–0.7)
Eosinophils Relative: 0 %
HCT: 44.7 % (ref 39.0–52.0)
HEMOGLOBIN: 15.9 g/dL (ref 13.0–17.0)
LYMPHS ABS: 0.4 10*3/uL — AB (ref 0.7–4.0)
Lymphocytes Relative: 9 %
MCH: 33.1 pg (ref 26.0–34.0)
MCHC: 35.6 g/dL (ref 30.0–36.0)
MCV: 93.1 fL (ref 78.0–100.0)
MONO ABS: 0.5 10*3/uL (ref 0.1–1.0)
MONOS PCT: 9 %
NEUTROS PCT: 82 %
Neutro Abs: 4.2 10*3/uL (ref 1.7–7.7)
Platelets: 151 10*3/uL (ref 150–400)
RBC: 4.8 MIL/uL (ref 4.22–5.81)
RDW: 13.3 % (ref 11.5–15.5)
WBC: 5.2 10*3/uL (ref 4.0–10.5)

## 2016-03-30 LAB — BASIC METABOLIC PANEL
Anion gap: 11 (ref 5–15)
BUN: 18 mg/dL (ref 6–20)
CHLORIDE: 101 mmol/L (ref 101–111)
CO2: 27 mmol/L (ref 22–32)
CREATININE: 1.12 mg/dL (ref 0.61–1.24)
Calcium: 9.6 mg/dL (ref 8.9–10.3)
GFR calc Af Amer: >60 mL/min (ref >60)
GFR calc non Af Amer: >60 mL/min (ref >60)
Glucose, Bld: 123 mg/dL — ABNORMAL HIGH (ref 65–99)
Potassium: 4.4 mmol/L (ref 3.5–5.1)
SODIUM: 139 mmol/L (ref 135–145)

## 2016-03-30 LAB — I-STAT TROPONIN, ED
Troponin i, poc: 0.01 ng/mL (ref 0.00–0.08)
Troponin i, poc: 0.01 ng/mL (ref 0.00–0.08)

## 2016-03-30 MED ORDER — IPRATROPIUM BROMIDE 0.02 % IN SOLN
0.5000 mg | Freq: Once | RESPIRATORY_TRACT | Status: DC
  Filled 2016-03-30: qty 2.5

## 2016-03-30 MED ORDER — ACETAMINOPHEN 500 MG PO TABS
1000.0000 mg | ORAL_TABLET | Freq: Once | ORAL | Status: AC
  Administered 2016-03-30: 1000 mg via ORAL
  Filled 2016-03-30: qty 2

## 2016-03-30 MED ORDER — ALBUTEROL SULFATE (2.5 MG/3ML) 0.083% IN NEBU
5.0000 mg | INHALATION_SOLUTION | Freq: Once | RESPIRATORY_TRACT | Status: DC
  Filled 2016-03-30: qty 6

## 2016-03-30 MED ORDER — AZITHROMYCIN 250 MG PO TABS: 250.0000 mg | ORAL_TABLET | Freq: Every day | ORAL | 0 refills | Status: DC

## 2016-03-30 MED ORDER — IOPAMIDOL (ISOVUE-370) INJECTION 76%
INTRAVENOUS | Status: AC
  Filled 2016-03-30: qty 100

## 2016-03-30 MED ORDER — IPRATROPIUM-ALBUTEROL 0.5-2.5 (3) MG/3ML IN SOLN
3.0000 mL | Freq: Once | RESPIRATORY_TRACT | Status: AC
  Administered 2016-03-30: 3 mL via RESPIRATORY_TRACT

## 2016-03-30 MED ORDER — IOPAMIDOL (ISOVUE-370) INJECTION 76%
100.0000 mL | Freq: Once | INTRAVENOUS | Status: AC | PRN
  Administered 2016-03-30: 100 mL via INTRAVENOUS

## 2016-03-30 MED ORDER — ALBUTEROL SULFATE (2.5 MG/3ML) 0.083% IN NEBU
2.5000 mg | INHALATION_SOLUTION | Freq: Once | RESPIRATORY_TRACT | Status: AC
  Administered 2016-03-30: 2.5 mg via RESPIRATORY_TRACT

## 2016-03-30 NOTE — Consult Note (Signed)
Medical Consultation   Bobby SmilingLarry G Matteucci  ZOX:[id number]  DOB: 06-24-1955  DOA: 03/30/2016  PCP: Johny BlamerHARRIS, WILLIAM, MD (Confirm with patient/family/NH records and if not entered, [facility])   Requesting physician: Dr. Patria Maneampos   Reason for consultation: Shortness of breath    History of Present Illness: Bobby Torres is an 60 y.o. male with past medical history of hypertension, diabetes, dyslipidemia who [facility] evaluation of shortness of breath started this am prior to admission. He was traveling to [place] and was there in casino, surrounded by many people who are smoking. He felt okay initially but after a day or so he noticed some cough and shortness of breath. His symptoms have not improved and he started to cough a lot to the point he could not catch his breath. He had chest pain with cough but says no chest pain at this time. No fevers, no chills. No palpitations. No leg swelling. He reported having angiogram few years back and was told it was clean.     Review of Systems:  ROS Constitutional: Negative for fever, chills, diaphoresis, activity change, appetite change and fatigue.  HENT: Negative for ear pain, nosebleeds, congestion, facial swelling, rhinorrhea, neck pain, neck stiffness and ear discharge.   Eyes: Negative for pain, discharge, redness, itching and visual disturbance.  Respiratory: Negative for cough, choking, chest tightness, shortness of breath, wheezing and stridor.   Cardiovascular: per HPI Gastrointestinal: Negative for abdominal distention.  Genitourinary: Negative for dysuria, urgency, frequency, hematuria, flank pain, decreased urine volume, difficulty urinating and dyspareunia.  Musculoskeletal: Negative for back pain, joint swelling, arthralgias and gait problem.  Neurological: Negative for dizziness, tremors, seizures, syncope, facial asymmetry, speech difficulty, weakness, light-headedness,  numbness and headaches.  Hematological: Negative for adenopathy. Does not bruise/bleed easily.  Psychiatric/Behavioral: Negative for hallucinations, behavioral problems, confusion, dysphoric mood, decreased concentration and agitation.    Past Medical History: Past Medical History:  Diagnosis Date  . Anxiety   . Diabetes mellitus without complication (HCC)   . GERD (gastroesophageal reflux disease)   . Hypertension   . Kidney stones   . Rotator cuff strain     Past Surgical History: Past Surgical History:  Procedure Laterality Date  . BACK SURGERY     x 2  . EYE SURGERY    . SHOULDER ARTHROSCOPY WITH SUBACROMIAL DECOMPRESSION Right 03/30/2015   Procedure: SHOULDER ARTHROSCOPY WITH SUBACROMIAL DECOMPRESSION Debridment and capsular release;  Surgeon: Jones BroomJustin Chandler, MD;  Location: [facility];  Service: Orthopedics;  Laterality: Right;  Capsular release     Allergies:  No Known Allergies   Social History:  reports that he has never smoked. He has never used smokeless tobacco. He reports that he drinks alcohol. He reports that he does not use drugs.   Family History: Hypertension in mother.    Physical Exam: Vitals:   03/30/16 1030 03/30/16 1100 03/30/16 1339 03/30/16 1450  BP: 160/67 141/77 160/69 131/78  Pulse: 94 95 107 104  Resp: 15 18 21 16   Temp:    99.1 F (37.3 C)  TempSrc:    Oral  SpO2: 96% 98% 98% 99%  Weight:      Height:        Constitutional: Appearance,  Alert and awake, oriented x3, not in any acute distress. Eyes: PERLA, EOMI, irises appear normal, anicteric sclera,  ENMT: external ears  and nose appear normal, normal hearing or hard of hearing            Lips appears normal, oropharynx mucosa, tongue, posterior pharynx appear normal  Neck: neck appears normal, no masses, normal ROM, no thyromegaly, no JVD  CVS: S1-S2 clear, +2 murmur in mid sternal area Respiratory:  clear to auscultation bilaterally, no wheezing, rales or  rhonchi. Respiratory effort normal. No accessory muscle use.  Abdomen: soft nontender, nondistended, normal bowel sounds, no hepatosplenomegaly, no hernias  Musculoskeletal: : no cyanosis, clubbing or edema noted bilaterally Neuro: Cranial nerves II-XII intact, strength, sensation, reflexes Psych: judgement and insight appear normal, stable mood and affect, mental status Skin: no rashes or lesions or ulcers, no induration or nodules    Data reviewed:  I have personally reviewed following labs and imaging studies Labs:  CBC:  Recent Labs Lab 03/30/16 1041  WBC 5.2  NEUTROABS 4.2  HGB 15.9  HCT 44.7  MCV 93.1  PLT 151    Basic Metabolic Panel:  Recent Labs Lab 03/30/16 1041  NA 139  K 4.4  CL 101  CO2 27  GLUCOSE 123*  BUN 18  CREATININE 1.12  CALCIUM 9.6   GFR Estimated Creatinine Clearance: 97 mL/min (by C-G formula based on SCr of 1.12 mg/dL). Liver Function Tests: No results for input(s): AST, ALT, ALKPHOS, BILITOT, PROT, ALBUMIN in the last 168 hours. No results for input(s): LIPASE, AMYLASE in the last 168 hours. No results for input(s): AMMONIA in the last 168 hours. Coagulation profile No results for input(s): INR, PROTIME in the last 168 hours.  Cardiac Enzymes: No results for input(s): CKTOTAL, CKMB, CKMBINDEX, TROPONINI in the last 168 hours. BNP: Invalid input(s): POCBNP CBG: No results for input(s): GLUCAP in the last 168 hours. D-Dimer No results for input(s): DDIMER in the last 72 hours. Hgb A1c No results for input(s): HGBA1C in the last 72 hours. Lipid Profile No results for input(s): CHOL, HDL, LDLCALC, TRIG, CHOLHDL, LDLDIRECT in the last 72 hours. Thyroid function studies No results for input(s): TSH, T4TOTAL, T3FREE, THYROIDAB in the last 72 hours.  Invalid input(s): FREET3 Anemia work up No results for input(s): VITAMINB12, FOLATE, FERRITIN, TIBC, IRON, RETICCTPCT in the last 72 hours. Urinalysis No results found for: COLORURINE,  APPEARANCEUR, LABSPEC, PHURINE, GLUCOSEU, HGBUR, BILIRUBINUR, KETONESUR, PROTEINUR, UROBILINOGEN, NITRITE, LEUKOCYTESUR   Microbiology No results found for this or any previous visit (from the past 240 hour(s)).     Inpatient Medications:   Scheduled Meds: . iopamidol       Continuous Infusions:   Radiological Exams on Admission: Dg Chest 2 View  Result Date: 03/30/2016 CLINICAL DATA:  New sudden onset sob, difficulty breathing this a.m, nonsmoker, states no prev hx of heart or lung disease EXAM: CHEST - 2 VIEW COMPARISON:  06/29/2007 FINDINGS: Stable subcentimeter calcified granuloma, right mid lung. Lungs are otherwise clear. Heart size and mediastinal contours are within normal limits. No effusion. Visualized bones unremarkable. IMPRESSION: No acute cardiopulmonary disease. Electronically Signed   By: Corlis Leak  Hassell M.D.   On: 03/30/2016 10:22   Ct Angio Chest Pe W Or Wo Contrast  Result Date: 03/30/2016 CLINICAL DATA:  Shortness of breath and cough for 2 days, history of prolonged travel, initial encounter EXAM: CT ANGIOGRAPHY CHEST WITH CONTRAST TECHNIQUE: Multidetector CT imaging of the chest was performed using the standard protocol during bolus administration of intravenous contrast. Multiplanar CT image reconstructions and MIPs were obtained to evaluate the vascular anatomy. CONTRAST:  100 mL Isovue 370.  COMPARISON:  None. FINDINGS: Cardiovascular: Thoracic aorta demonstrates mild calcific changes without aneurysmal dilatation or dissection. The pulmonary artery is well visualized and demonstrates a normal branching pattern. Mild coronary calcifications are seen. Cardiac structures are not significantly enlarged. Mediastinum/Nodes: No significant hilar or mediastinal lymphadenopathy is noted. Scattered small mediastinal lymph nodes are seen. The thoracic inlet is within normal limits. No axillary adenopathy is noted. Lungs/Pleura: Lungs are well aerated bilaterally without focal  infiltrate or sizable effusion. Calcified granuloma is noted in the right middle lobe. Upper Abdomen: No acute abnormality. Musculoskeletal: Mild degenerative change of thoracic spine is noted. Review of the MIP images confirms the above findings. IMPRESSION: No evidence of pulmonary emboli. No acute abnormality is seen. Electronically Signed   By: Alcide Clever M.D.   On: 03/30/2016 12:06    Impression/Recommendations  Active Problems: Shortness of breath / Chest pain - Was traveling to [place] and was in casino for 3 days surrounded by lot of people who smoke and his symptoms started after that  - CT angio chest normal - CXR normal - Normal troponin level - No acute ischemic changes on 12 lead EKG  - No chest pain and shortness of breath better - He has cough and would likely benefit from z -pack - He had angiogram few years ago and was told it was clean    Time Spent: 25 minutes   Elisabeth Pigeon, Jorgia Manthei M.D. [facility] 03/30/2016, 2:56 PM

## 2016-03-30 NOTE — ED Triage Notes (Signed)
Per EMS patient c/o intermittent shortness of breath which began today.  Reports nonproductive cough x 2 days and states he just got home from an 18 hour car trip yesterday.

## 2016-03-30 NOTE — ED Provider Notes (Signed)
[facility]-EMERGENCY DEPT Provider Note   CSN: [id number] Arrival date & time: 03/30/16  [id number]     History              Chief Complaint    Chief Complaint  Patient presents with  . Shortness of Breath    HPI Bobby Torres is a [age] y.o. male.  Patient with PMH of HTN, DM, HL presents to the ED with a chief complaint of shortness of breath.  Bobby Torres states that the symptoms started this morning.  States that it feels like Bobby Torres can't catch his breath.  Bobby Torres reports some central chest tightness and pain.  Bobby Torres states that it felt like Bobby Torres was wheezing earlier today.  Bobby Torres has not tried taking anything for his symptoms.  The symptoms are worsened with exertion.  Additionally, patient states that Bobby Torres has been traveling for the past month or so, and just completed an 18 hour car drive.  Bobby Torres denies any pain or swelling in his legs.  Bobby Torres denies any hx of PE/DVT.  The history is provided by the patient. No language interpreter was used.        Past Medical History:  Diagnosis Date  . Anxiety   . Diabetes mellitus without complication (HCC)   . GERD (gastroesophageal reflux disease)   . Hypertension   . Kidney stones   . Rotator cuff strain     There are no active problems to display for this patient.        Past Surgical History:  Procedure Laterality Date  . BACK SURGERY     x 2  . EYE SURGERY    . SHOULDER ARTHROSCOPY WITH SUBACROMIAL DECOMPRESSION Right 03/30/2015   Procedure: SHOULDER ARTHROSCOPY WITH SUBACROMIAL DECOMPRESSION Debridment and capsular release;  Surgeon: Jones BroomJustin Chandler, MD;  Location: [facility];  Service: Orthopedics;  Laterality: Right;  Capsular release       Home Medications                      Prior to Admission medications   Medication Sig Start Date End Date Taking? Authorizing Provider  atorvastatin (LIPITOR) 40 MG tablet Take 40 mg by mouth daily.   Yes Historical Provider, MD  clonazePAM (KLONOPIN) 1 MG tablet  Take 1 mg by mouth at bedtime.   Yes Historical Provider, MD  diphenhydramine-acetaminophen (TYLENOL PM) 25-500 MG TABS tablet Take 0.5 tablets by mouth at bedtime as needed (sleep/pain).   Yes Historical Provider, MD  empagliflozin (JARDIANCE) 10 MG TABS tablet Take 10 mg by mouth daily.   Yes Historical Provider, MD  glipiZIDE (GLUCOTROL) 10 MG tablet Take 10 mg by mouth 2 (two) times daily before a meal.   Yes Historical Provider, MD  glucosamine-chondroitin 500-400 MG tablet Take 2 tablets by mouth daily.   Yes Historical Provider, MD  Ibuprofen-Diphenhydramine Cit (ADVIL PM PO) Take 0.5 tablets by mouth at bedtime as needed (pain/sleep).   Yes Historical Provider, MD  lisinopril (PRINIVIL,ZESTRIL) 20 MG tablet Take 20 mg by mouth daily.   Yes Historical Provider, MD  lisinopril-hydrochlorothiazide (PRINZIDE,ZESTORETIC) 20-12.5 MG tablet Take 1 tablet by mouth daily. 02/22/16  Yes Historical Provider, MD  metFORMIN (GLUCOPHAGE-XR) 500 MG 24 hr tablet Take 1,000 mg by mouth daily. 03/04/16  Yes Historical Provider, MD  Omega-3 Fatty Acids (FISH OIL PO) Take 2 capsules by mouth daily.   Yes Historical Provider, MD  pantoprazole (PROTONIX) 40 MG tablet Take 40 mg by mouth daily.  Yes Historical Provider, MD  pioglitazone (ACTOS) 30 MG tablet Take 30 mg by mouth daily.   Yes Historical Provider, MD  docusate sodium (COLACE) 100 MG capsule Take 1 capsule (100 mg total) by mouth 3 (three) times daily as needed. Patient not taking: Reported on 03/30/2016 03/30/15   Jiles Harold, PA-C  oxyCODONE-acetaminophen (ROXICET) 5-325 MG tablet Take 1-2 tablets by mouth every 4 (four) hours as needed for severe pain. Patient not taking: Reported on 03/30/2016 03/30/15   Jiles Harold, PA-C    Family History History reviewed. No pertinent family history.  Social History        Social History   Substance Use Topics   . Smoking status: Never Smoker   . Smokeless  tobacco: Never Used   . Alcohol use Yes     Comment: wine occasionally      Allergies           Patient has no known allergies.   Review of Systems Review of Systems  Respiratory: Positive for cough and shortness of breath.   All other systems reviewed and are negative.    Physical Exam Updated Vital Signs BP 158/73   Pulse 98   Temp 98.6 F (37 C) (Oral)   Resp 17   Ht 6\' 1"  (1.854 m)   Wt 121.6 kg   SpO2 97%   BMI 35.36 kg/m   Physical Exam  Constitutional: Bobby Torres is oriented to person, place, and time. Bobby Torres appears well-developed and well-nourished.  HENT:  Head: Normocephalic and atraumatic.  Eyes: Conjunctivae and EOM are normal. Pupils are equal, round, and reactive to light. Right eye exhibits no discharge. Left eye exhibits no discharge. No scleral icterus.  Neck: Normal range of motion. Neck supple. No JVD present.  Cardiovascular: Normal rate, regular rhythm and normal heart sounds.  Exam reveals no gallop and no friction rub.   No murmur heard. Pulmonary/Chest: Effort normal and breath sounds normal. No respiratory distress. Bobby Torres has no wheezes. Bobby Torres has no rales. Bobby Torres exhibits no tenderness.  CTAB  Abdominal: Soft. Bobby Torres exhibits no distension and no mass. There is no tenderness. There is no rebound and no guarding.  Musculoskeletal: Normal range of motion. Bobby Torres exhibits no edema or tenderness.  Neurological: Bobby Torres is alert and oriented to person, place, and time.  Skin: Skin is warm and dry.  Psychiatric: Bobby Torres has a normal mood and affect. His behavior is normal. Judgment and thought content normal.  Nursing note and vitals reviewed.    ED Treatments / Results  Labs (all labs ordered are listed, but only abnormal results are displayed)      Labs Reviewed  CBC WITH DIFFERENTIAL/PLATELET - Abnormal; Notable for the following:       Result Value    Lymphs Abs 0.4 (*)    All other components within normal limits  BASIC METABOLIC PANEL - Abnormal; Notable  for the following:    Glucose, Bld 123 (*)    All other components within normal limits  I-STAT TROPOININ, ED    EKG      EKG Interpretation None      EKG Interpretation  Date/Time:  Wednesday March 30 2016 09:39:03 EST Ventricular Rate:  98 PR Interval:    QRS Duration: 86 QT Interval:  338 QTC Calculation: 432 R Axis:   3 Text Interpretation:  Sinus rhythm Minimal ST elevation, anterior leads Baseline wander in lead(s) V1 V2 No significant change was found Artifact Confirmed by CAMPOS  MD, KEVIN ([id number]) on  03/30/2016 12:20:25 PM        Radiology  Imaging Results (Last 48 hours)  Dg Chest 2 View  Result Date: 03/30/2016 CLINICAL DATA:  New sudden onset sob, difficulty breathing this a.m, nonsmoker, states no prev hx of heart or lung disease EXAM: CHEST - 2 VIEW COMPARISON:  06/29/2007 FINDINGS: Stable subcentimeter calcified granuloma, right mid lung. Lungs are otherwise clear. Heart size and mediastinal contours are within normal limits. No effusion. Visualized bones unremarkable. IMPRESSION: No acute cardiopulmonary disease. Electronically Signed   By: Corlis Leak  Hassell M.D.   On: 03/30/2016 10:22     Procedures Procedures (including critical care time)  Medications Ordered in ED Medications  acetaminophen (TYLENOL) tablet 1,000 mg (1,000 mg Oral Given 03/30/16 1035)     Initial Impression / Assessment and Plan / ED Course  I have reviewed the triage vital signs and the nursing notes.  Pertinent labs & imaging results that were available during my care of the patient were reviewed by me and considered in my medical decision making (see chart for details).  Clinical Course     Patient with shortness of breath.  Consider PE given recent long car trip.  Bobby Torres does have central chest pain as well, will check labs.    Cardiac risk factors are HTN, HL, DM, BMI.  HEART score is 4.   Still feels short of breath.  CT PE study is negative.  Could be  cough and URI related, but I don't think this adequately explains the SOB.    I discussed the case with Dr. Patria Maneampos, who agrees with my plan for observation admission for chest pain rule out.  I discussed the case with Dr. Elisabeth Pigeonevine, who believes that the patient might be low enough risk to send home for outpatient workup.  I discussed this with the patient, who would prefer this option if Dr. Elisabeth Pigeonevine recommends it after formal consultation.  1:01 PM Tried giving a patient a breathing treatment, but Bobby Torres denies any relief.  I appreciate Dr. Elisabeth Pigeonevine for consulting on the patient, especially since the cough symptoms started a few days ago, but the significant shortness of breath and CP only started this morning.  Will await Dr. Armando Gangevine's recommendations.  2:51 PM Appreciate consultation from Dr. Elisabeth Pigeonevine.  SOB and chest tightness not thought to be related to ACS, but more likely due to recent heavy smoke exposure at a casino.  Recommends adding azithromycin and having the patient continue taking mucinex.  Patient understands and agrees with the plan.  Final Clinical Impressions(s) / ED Diagnoses   1. Shortness of breath  New Prescriptions    New Prescriptions   No medications on file      Roxy HorsemanRobert Kelsee Preslar, PA-C 03/30/16 1453    Azalia BilisKevin Campos, MD 03/31/16 [phone]

## 2016-03-30 NOTE — Discharge Instructions (Signed)
Continue taking the Mucinex.  Return if your symptoms worsen.  Please follow-up with your doctor.

## 2016-04-01 DIAGNOSIS — J208 Acute bronchitis due to other specified organisms: Secondary | ICD-10-CM | POA: Diagnosis not present

## 2016-04-01 DIAGNOSIS — E291 Testicular hypofunction: Secondary | ICD-10-CM | POA: Diagnosis not present

## 2016-04-04 DIAGNOSIS — J189 Pneumonia, unspecified organism: Secondary | ICD-10-CM | POA: Diagnosis not present

## 2016-05-10 DIAGNOSIS — L03115 Cellulitis of right lower limb: Secondary | ICD-10-CM | POA: Diagnosis not present

## 2016-05-10 DIAGNOSIS — E119 Type 2 diabetes mellitus without complications: Secondary | ICD-10-CM | POA: Diagnosis not present

## 2016-05-10 DIAGNOSIS — I10 Essential (primary) hypertension: Secondary | ICD-10-CM | POA: Diagnosis not present

## 2016-05-10 DIAGNOSIS — E7800 Pure hypercholesterolemia, unspecified: Secondary | ICD-10-CM | POA: Diagnosis not present

## 2016-05-10 DIAGNOSIS — E291 Testicular hypofunction: Secondary | ICD-10-CM | POA: Diagnosis not present

## 2016-05-17 DIAGNOSIS — E291 Testicular hypofunction: Secondary | ICD-10-CM | POA: Diagnosis not present

## 2016-06-08 DIAGNOSIS — E291 Testicular hypofunction: Secondary | ICD-10-CM | POA: Diagnosis not present

## 2016-07-06 DIAGNOSIS — E291 Testicular hypofunction: Secondary | ICD-10-CM | POA: Diagnosis not present

## 2016-08-05 DIAGNOSIS — E291 Testicular hypofunction: Secondary | ICD-10-CM | POA: Diagnosis not present

## 2016-09-02 DIAGNOSIS — E291 Testicular hypofunction: Secondary | ICD-10-CM | POA: Diagnosis not present

## 2016-10-03 DIAGNOSIS — E291 Testicular hypofunction: Secondary | ICD-10-CM | POA: Diagnosis not present

## 2016-11-08 DIAGNOSIS — E119 Type 2 diabetes mellitus without complications: Secondary | ICD-10-CM | POA: Diagnosis not present

## 2016-11-08 DIAGNOSIS — Z125 Encounter for screening for malignant neoplasm of prostate: Secondary | ICD-10-CM | POA: Diagnosis not present

## 2016-11-08 DIAGNOSIS — K219 Gastro-esophageal reflux disease without esophagitis: Secondary | ICD-10-CM | POA: Diagnosis not present

## 2016-11-08 DIAGNOSIS — E291 Testicular hypofunction: Secondary | ICD-10-CM | POA: Diagnosis not present

## 2016-11-08 DIAGNOSIS — I10 Essential (primary) hypertension: Secondary | ICD-10-CM | POA: Diagnosis not present

## 2016-11-22 ENCOUNTER — Emergency Department (HOSPITAL_COMMUNITY)
Admission: EM | Admit: 2016-11-22 | Discharge: 2016-11-23 | Disposition: A | Discharge status: Home / Self Care | Payer: BLUE CROSS/BLUE SHIELD | Attending: Emergency Medicine | Admitting: Emergency Medicine

## 2016-11-22 ENCOUNTER — Encounter (HOSPITAL_COMMUNITY): Payer: Self-pay

## 2016-11-22 DIAGNOSIS — E119 Type 2 diabetes mellitus without complications: Secondary | ICD-10-CM | POA: Insufficient documentation

## 2016-11-22 DIAGNOSIS — R300 Dysuria: Secondary | ICD-10-CM | POA: Diagnosis not present

## 2016-11-22 DIAGNOSIS — R310 Gross hematuria: Secondary | ICD-10-CM | POA: Insufficient documentation

## 2016-11-22 DIAGNOSIS — Z7984 Long term (current) use of oral hypoglycemic drugs: Secondary | ICD-10-CM | POA: Diagnosis not present

## 2016-11-22 DIAGNOSIS — Z79899 Other long term (current) drug therapy: Secondary | ICD-10-CM | POA: Insufficient documentation

## 2016-11-22 DIAGNOSIS — R319 Hematuria, unspecified: Secondary | ICD-10-CM | POA: Diagnosis not present

## 2016-11-22 DIAGNOSIS — I10 Essential (primary) hypertension: Secondary | ICD-10-CM | POA: Diagnosis not present

## 2016-11-22 NOTE — ED Triage Notes (Signed)
Pt complains of hematuria since about 8pm, he states it was first dark then it was red Pt states that he has some discomfort

## 2016-11-22 NOTE — ED Notes (Signed)
Bed: WTR8 Expected date:  Expected time:  Means of arrival:  Comments: 

## 2016-11-23 ENCOUNTER — Encounter (HOSPITAL_COMMUNITY): Payer: Self-pay | Admitting: Radiology

## 2016-11-23 ENCOUNTER — Emergency Department (HOSPITAL_COMMUNITY): Payer: BLUE CROSS/BLUE SHIELD

## 2016-11-23 DIAGNOSIS — R319 Hematuria, unspecified: Secondary | ICD-10-CM | POA: Diagnosis not present

## 2016-11-23 DIAGNOSIS — R300 Dysuria: Secondary | ICD-10-CM | POA: Diagnosis not present

## 2016-11-23 LAB — I-STAT CHEM 8, ED
BUN: 22 mg/dL — AB (ref 6–20)
CREATININE: 1.4 mg/dL — AB (ref 0.61–1.24)
Calcium, Ion: 1.09 mmol/L — ABNORMAL LOW (ref 1.15–1.40)
Chloride: 100 mmol/L — ABNORMAL LOW (ref 101–111)
GLUCOSE: 183 mg/dL — AB (ref 65–99)
HCT: 44 % (ref 39.0–52.0)
HEMOGLOBIN: 15 g/dL (ref 13.0–17.0)
POTASSIUM: 4.4 mmol/L (ref 3.5–5.1)
Sodium: 135 mmol/L (ref 135–145)
TCO2: 20 mmol/L (ref 0–100)

## 2016-11-23 LAB — URINALYSIS, ROUTINE W REFLEX MICROSCOPIC

## 2016-11-23 LAB — CBC WITH DIFFERENTIAL/PLATELET
BASOS ABS: 0 10*3/uL (ref 0.0–0.1)
BASOS PCT: 0 %
EOS PCT: 1 %
Eosinophils Absolute: 0.1 10*3/uL (ref 0.0–0.7)
HCT: 42.2 % (ref 39.0–52.0)
Hemoglobin: 15 g/dL (ref 13.0–17.0)
Lymphocytes Relative: 15 %
Lymphs Abs: 1.4 10*3/uL (ref 0.7–4.0)
MCH: 33 pg (ref 26.0–34.0)
MCHC: 35.5 g/dL (ref 30.0–36.0)
MCV: 93 fL (ref 78.0–100.0)
MONO ABS: 0.9 10*3/uL (ref 0.1–1.0)
Monocytes Relative: 10 %
NEUTROS ABS: 6.6 10*3/uL (ref 1.7–7.7)
Neutrophils Relative %: 74 %
Platelets: 159 10*3/uL (ref 150–400)
RBC: 4.54 MIL/uL (ref 4.22–5.81)
RDW: 14.2 % (ref 11.5–15.5)
WBC: 8.9 10*3/uL (ref 4.0–10.5)

## 2016-11-23 LAB — PROTIME-INR
INR: 1
PROTHROMBIN TIME: 13.2 s (ref 11.4–15.2)

## 2016-11-23 LAB — URINALYSIS, MICROSCOPIC (REFLEX)

## 2016-11-23 MED ORDER — CEPHALEXIN 500 MG PO CAPS: 500.0000 mg | ORAL_CAPSULE | Freq: Four times a day (QID) | ORAL | 0 refills | Status: DC

## 2016-11-23 MED ORDER — CEPHALEXIN 500 MG PO CAPS
500.0000 mg | ORAL_CAPSULE | Freq: Once | ORAL | Status: AC
  Administered 2016-11-23: 500 mg via ORAL
  Filled 2016-11-23: qty 1

## 2016-11-23 NOTE — ED Provider Notes (Signed)
[facility]-EMERGENCY DEPT Provider Note   CSN: [id number] Arrival date & time: 11/22/16  2306     History   Chief Complaint Chief Complaint  Patient presents with  . Hematuria    HPI Bobby Torres is a 61 y.o. male.  The history is provided by the patient.  Hematuria  This is a new problem. The current episode started 3 to 5 hours ago. The problem occurs constantly. The problem has not changed since onset.Pertinent negatives include no chest pain, no abdominal pain, no headaches and no shortness of breath. Associated symptoms comments: Pain just at the end of his stream. Nothing aggravates the symptoms. Nothing relieves the symptoms. He has tried nothing for the symptoms. The treatment provided no relief.  On aspirin, no flank pain.  No f/c/r. No trauma.    Past Medical History:  Diagnosis Date  . Anxiety   . Diabetes mellitus without complication (HCC)   . GERD (gastroesophageal reflux disease)   . Hypertension   . Kidney stones   . Rotator cuff strain     Patient Active Problem List   Diagnosis Date Noted  . SOB (shortness of breath)     Past Surgical History:  Procedure Laterality Date  . BACK SURGERY     x 2  . EYE SURGERY    . SHOULDER ARTHROSCOPY WITH SUBACROMIAL DECOMPRESSION Right 03/30/2015   Procedure: SHOULDER ARTHROSCOPY WITH SUBACROMIAL DECOMPRESSION Debridment and capsular release;  Surgeon: [name], MD;  Location: [facility];  Service: Orthopedics;  Laterality: Right;  Capsular release       Home Medications    Prior to Admission medications   Medication Sig Start Date End Date Taking? Authorizing Provider  atorvastatin (LIPITOR) 40 MG tablet Take 40 mg by mouth daily.    [provider]  azithromycin (ZITHROMAX) 250 MG tablet Take 1 tablet (250 mg total) by mouth daily. Take first 2 tablets together, then 1 every day until finished. 03/30/16   Roxy Horseman, PA-C  cephALEXin (KEFLEX) 500 MG capsule Take 1 capsule  (500 mg total) by mouth 4 (four) times daily. 11/23/16   Hava Massingale, MD  clonazePAM (KLONOPIN) 1 MG tablet Take 1 mg by mouth at bedtime.    [provider]  diphenhydramine-acetaminophen (TYLENOL PM) 25-500 MG TABS tablet Take 0.5 tablets by mouth at bedtime as needed (sleep/pain).    [provider]  docusate sodium (COLACE) 100 MG capsule Take 1 capsule (100 mg total) by mouth 3 (three) times daily as needed. Patient not taking: Reported on 03/30/2016 03/30/15   Jiles Harold, PA-C  empagliflozin (JARDIANCE) 10 MG TABS tablet Take 10 mg by mouth daily.    [provider]  glipiZIDE (GLUCOTROL) 10 MG tablet Take 10 mg by mouth 2 (two) times daily before a meal.    [provider]  glucosamine-chondroitin 500-400 MG tablet Take 2 tablets by mouth daily.    [provider]  Ibuprofen-Diphenhydramine Cit (ADVIL PM PO) Take 0.5 tablets by mouth at bedtime as needed (pain/sleep).    [provider]  lisinopril (PRINIVIL,ZESTRIL) 20 MG tablet Take 20 mg by mouth daily.    [provider]  lisinopril-hydrochlorothiazide (PRINZIDE,ZESTORETIC) 20-12.5 MG tablet Take 1 tablet by mouth daily. 02/22/16   [provider]  metFORMIN (GLUCOPHAGE-XR) 500 MG 24 hr tablet Take 1,000 mg by mouth daily. 03/04/16   [provider]  Omega-3 Fatty Acids (FISH OIL PO) Take 2 capsules by mouth daily.    [provider]  oxyCODONE-acetaminophen (ROXICET) 5-325 MG tablet Take 1-2 tablets by mouth every 4 (four) hours as needed for severe pain. Patient not taking: Reported on 03/30/2016 03/30/15   Jiles Harold, PA-C  pantoprazole (PROTONIX) 40 MG tablet Take 40 mg by mouth daily.    [provider]  pioglitazone (ACTOS) 30 MG tablet Take 30 mg by mouth daily.    [provider]    Family History History reviewed. No pertinent family history.  Social History Social History  Substance Use Topics    . Smoking status: Never Smoker  . Smokeless tobacco: Never Used  . Alcohol use Yes     Comment: wine occasionally     Allergies   Patient has no known allergies.   Review of Systems Review of Systems  Constitutional: Negative for fever.  Respiratory: Negative for shortness of breath.   Cardiovascular: Negative for chest pain.  Gastrointestinal: Negative for abdominal pain and nausea.  Genitourinary: Positive for dysuria and hematuria. Negative for discharge, flank pain, penile pain, penile swelling and testicular pain.  Neurological: Negative for headaches.  All other systems reviewed and are negative.    Physical Exam Updated Vital Signs BP (!) 148/83 (BP Location: Left Arm)   Pulse 66   Temp 99.3 F (37.4 C) (Oral)   Resp 15   SpO2 92%   Physical Exam  Constitutional: He is oriented to person, place, and time. He appears well-developed and well-nourished. No distress.  HENT:  Head: Normocephalic and atraumatic.  Mouth/Throat: No oropharyngeal exudate.  Eyes: Pupils are equal, round, and reactive to light. Conjunctivae are normal.  Neck: Normal range of motion. Neck supple. No JVD present.  Cardiovascular: Normal rate, regular rhythm, normal heart sounds and intact distal pulses.   Pulmonary/Chest: Effort normal and breath sounds normal. No stridor.  Abdominal: Soft. Bowel sounds are normal. He exhibits no mass. There is no tenderness. There is no rebound and no guarding.  Musculoskeletal: Normal range of motion.  Neurological: He is alert and oriented to person, place, and time. He displays normal reflexes.  Skin: Skin is warm and dry. Capillary refill takes less than 2 seconds.     ED Treatments / Results   Vitals:   11/22/16 2328 11/23/16 0232  BP: (!) 162/74 (!) 148/83  Pulse: 95 66  Resp: 18 15  Temp: 99.3 F (37.4 C)   SpO2: 97% 92%   Labs (all labs ordered are listed, but only abnormal results are displayed) Results for orders placed or performed  during the hospital encounter of 11/22/16  Urinalysis, Routine w reflex microscopic- may I&O cath if menses  Result Value Ref Range   Color, Urine RED (A) YELLOW   APPearance TURBID (A) CLEAR   Specific Gravity, Urine  1.005 - 1.030    TEST NOT REPORTED DUE TO COLOR INTERFERENCE OF URINE PIGMENT   pH  5.0 - 8.0    TEST NOT REPORTED DUE TO COLOR INTERFERENCE OF URINE PIGMENT   Glucose, UA (A) NEGATIVE mg/dL    TEST NOT REPORTED DUE TO COLOR INTERFERENCE OF URINE PIGMENT   Hgb urine dipstick (A) NEGATIVE    TEST NOT REPORTED DUE TO COLOR INTERFERENCE OF URINE PIGMENT   Bilirubin Urine (A) NEGATIVE    TEST NOT REPORTED DUE TO COLOR INTERFERENCE OF URINE PIGMENT   Ketones, ur (A) NEGATIVE mg/dL    TEST NOT REPORTED DUE TO COLOR INTERFERENCE OF URINE PIGMENT   Protein, ur (A) NEGATIVE mg/dL    TEST NOT REPORTED  DUE TO COLOR INTERFERENCE OF URINE PIGMENT   Nitrite (A) NEGATIVE    TEST NOT REPORTED DUE TO COLOR INTERFERENCE OF URINE PIGMENT   Leukocytes, UA (A) NEGATIVE    TEST NOT REPORTED DUE TO COLOR INTERFERENCE OF URINE PIGMENT  Urinalysis, Microscopic (reflex)  Result Value Ref Range   RBC / HPF TOO NUMEROUS TO COUNT 0 - 5 RBC/hpf   WBC, UA 0-5 0 - 5 WBC/hpf   Bacteria, UA FEW (A) NONE SEEN   Squamous Epithelial / LPF 0-5 (A) NONE SEEN  CBC with Differential/Platelet  Result Value Ref Range   WBC 8.9 4.0 - 10.5 K/uL   RBC 4.54 4.22 - 5.81 MIL/uL   Hemoglobin 15.0 13.0 - 17.0 g/dL   HCT 16.1 09.6 - 04.5 %   MCV 93.0 78.0 - 100.0 fL   MCH 33.0 26.0 - 34.0 pg   MCHC 35.5 30.0 - 36.0 g/dL   RDW 40.9 81.1 - 91.4 %   Platelets 159 150 - 400 K/uL   Neutrophils Relative % 74 %   Neutro Abs 6.6 1.7 - 7.7 K/uL   Lymphocytes Relative 15 %   Lymphs Abs 1.4 0.7 - 4.0 K/uL   Monocytes Relative 10 %   Monocytes Absolute 0.9 0.1 - 1.0 K/uL   Eosinophils Relative 1 %   Eosinophils Absolute 0.1 0.0 - 0.7 K/uL   Basophils Relative 0 %   Basophils Absolute 0.0 0.0 - 0.1 K/uL  Protime-INR   Result Value Ref Range   Prothrombin Time 13.2 11.4 - 15.2 seconds   INR 1.00   I-Stat Chem 8, ED  Result Value Ref Range   Sodium 135 135 - 145 mmol/L   Potassium 4.4 3.5 - 5.1 mmol/L   Chloride 100 (L) 101 - 111 mmol/L   BUN 22 (H) 6 - 20 mg/dL   Creatinine, Ser 7.82 (H) 0.61 - 1.24 mg/dL   Glucose, Bld 956 (H) 65 - 99 mg/dL   Calcium, Ion 2.13 (L) 1.15 - 1.40 mmol/L   TCO2 20 0 - 100 mmol/L   Hemoglobin 15.0 13.0 - 17.0 g/dL   HCT 08.6 57.8 - 46.9 %   Ct Renal Stone Study  Result Date: 11/23/2016 CLINICAL DATA:  Acute onset of dysuria and hematuria. Low-grade fever. Initial encounter. EXAM: CT ABDOMEN AND PELVIS WITHOUT CONTRAST TECHNIQUE: Multidetector CT imaging of the abdomen and pelvis was performed following the standard protocol without IV contrast. COMPARISON:  None. FINDINGS: Lower chest: A small 5 mm nodule is noted at the right lung base (image 54 of 92). The visualized portions of the mediastinum are unremarkable. Hepatobiliary: The liver is unremarkable in appearance. The gallbladder is unremarkable in appearance. The common bile duct remains normal in caliber. Pancreas: The pancreas is within normal limits. Spleen: The spleen is unremarkable in appearance. Adrenals/Urinary Tract: The adrenal glands are unremarkable in appearance. Mild nonspecific perinephric stranding is noted bilaterally. There is no evidence of hydronephrosis. No renal or ureteral stones are identified. Stomach/Bowel: The stomach is unremarkable in appearance. The small bowel is within normal limits. The appendix is normal in caliber, without evidence of appendicitis. The colon is unremarkable in appearance. Vascular/Lymphatic: Minimal calcification is noted at the abdominal aorta. A circumaortic left renal vein is noted. The inferior vena cava is grossly unremarkable. No retroperitoneal lymphadenopathy is seen. No pelvic sidewall lymphadenopathy is identified. Reproductive: The bladder is mildly distended. Mild  soft tissue inflammation about the bladder may reflect cystitis. The prostate is normal in size. Other:  No additional soft tissue abnormalities are seen. Musculoskeletal: No acute osseous abnormalities are identified. The visualized musculature is unremarkable in appearance. IMPRESSION: 1. Mild soft tissue inflammation about the bladder may reflect cystitis. 2. Circumaortic left renal vein incidentally noted. 3. 5 mm nodule at the right lung base. No follow-up needed if patient is low-risk. Non-contrast chest CT can be considered in 12 months if patient is high-risk. This recommendation follows the consensus statement: Guidelines for Management of Incidental Pulmonary Nodules Detected on CT Images: From the Fleischner Society 2017; Radiology 2017; 284:228-243. Electronically Signed   By: Roanna Raider M.D.   On: 11/23/2016 01:58   Radiology Ct Renal Stone Study  Result Date: 11/23/2016 CLINICAL DATA:  Acute onset of dysuria and hematuria. Low-grade fever. Initial encounter. EXAM: CT ABDOMEN AND PELVIS WITHOUT CONTRAST TECHNIQUE: Multidetector CT imaging of the abdomen and pelvis was performed following the standard protocol without IV contrast. COMPARISON:  None. FINDINGS: Lower chest: A small 5 mm nodule is noted at the right lung base (image 54 of 92). The visualized portions of the mediastinum are unremarkable. Hepatobiliary: The liver is unremarkable in appearance. The gallbladder is unremarkable in appearance. The common bile duct remains normal in caliber. Pancreas: The pancreas is within normal limits. Spleen: The spleen is unremarkable in appearance. Adrenals/Urinary Tract: The adrenal glands are unremarkable in appearance. Mild nonspecific perinephric stranding is noted bilaterally. There is no evidence of hydronephrosis. No renal or ureteral stones are identified. Stomach/Bowel: The stomach is unremarkable in appearance. The small bowel is within normal limits. The appendix is normal in caliber,  without evidence of appendicitis. The colon is unremarkable in appearance. Vascular/Lymphatic: Minimal calcification is noted at the abdominal aorta. A circumaortic left renal vein is noted. The inferior vena cava is grossly unremarkable. No retroperitoneal lymphadenopathy is seen. No pelvic sidewall lymphadenopathy is identified. Reproductive: The bladder is mildly distended. Mild soft tissue inflammation about the bladder may reflect cystitis. The prostate is normal in size. Other: No additional soft tissue abnormalities are seen. Musculoskeletal: No acute osseous abnormalities are identified. The visualized musculature is unremarkable in appearance. IMPRESSION: 1. Mild soft tissue inflammation about the bladder may reflect cystitis. 2. Circumaortic left renal vein incidentally noted. 3. 5 mm nodule at the right lung base. No follow-up needed if patient is low-risk. Non-contrast chest CT can be considered in 12 months if patient is high-risk. This recommendation follows the consensus statement: Guidelines for Management of Incidental Pulmonary Nodules Detected on CT Images: From the Fleischner Society 2017; Radiology 2017; 284:228-243. Electronically Signed   By: Roanna Raider M.D.   On: 11/23/2016 01:58    Procedures Procedures (including critical care time)  Medications Ordered in ED Medications  cephALEXin (KEFLEX) capsule 500 mg (not administered)      Final Clinical Impressions(s) / ED Diagnoses   Final diagnoses:  Gross hematuria  The patient is very well appearing and has been observed in the ED.  Strict return precautions given for  intractable abdominal pain, hard or rigid abdomen, inability to pass gas or stool, intractable vomiting, bleeding, passing clots, inability to urinate, fevers > 101 or  Diarrhea, inability to tolerate oral liquids or foods, shortness of breath, changes in vision or thinking, chest pain, dyspnea on exertion, weakness or numbness or any concerns. No signs of  systemic illness or infection. The patient is nontoxic-appearing on exam and vital signs are within normal limits.  Will start keflex x 7 days and have patient follow up with urology.  Have also  instructed patient to drink only water and no etoh and follow up with their PMD to schedule outpatient CT scan to assess stability of nodule seen on ct scan.    I have reviewed the triage vital signs and the nursing notes. Pertinent labs &imaging results that were available during my care of the patient were reviewed by me and considered in my medical decision making (see chart for details).  After history, exam, and medical workup I feel the patient has been appropriately medically screened and is safe for discharge home. Pertinent diagnoses were discussed with the patient. Patient was given return precautions.   New Prescriptions New Prescriptions   CEPHALEXIN (KEFLEX) 500 MG CAPSULE    Take 1 capsule (500 mg total) by mouth 4 (four) times daily.     Louise Victory, MD 11/23/16 [phone]

## 2016-11-24 DIAGNOSIS — R310 Gross hematuria: Secondary | ICD-10-CM | POA: Diagnosis not present

## 2016-11-24 DIAGNOSIS — R911 Solitary pulmonary nodule: Secondary | ICD-10-CM | POA: Diagnosis not present

## 2016-12-13 DIAGNOSIS — E291 Testicular hypofunction: Secondary | ICD-10-CM | POA: Diagnosis not present

## 2016-12-22 DIAGNOSIS — R310 Gross hematuria: Secondary | ICD-10-CM | POA: Diagnosis not present

## 2016-12-22 DIAGNOSIS — Z125 Encounter for screening for malignant neoplasm of prostate: Secondary | ICD-10-CM | POA: Diagnosis not present

## 2016-12-30 DIAGNOSIS — R310 Gross hematuria: Secondary | ICD-10-CM | POA: Diagnosis not present

## 2016-12-30 DIAGNOSIS — K760 Fatty (change of) liver, not elsewhere classified: Secondary | ICD-10-CM | POA: Diagnosis not present

## 2017-01-06 DIAGNOSIS — R310 Gross hematuria: Secondary | ICD-10-CM | POA: Diagnosis not present

## 2017-01-12 DIAGNOSIS — E291 Testicular hypofunction: Secondary | ICD-10-CM | POA: Diagnosis not present

## 2017-01-31 DIAGNOSIS — J209 Acute bronchitis, unspecified: Secondary | ICD-10-CM | POA: Diagnosis not present

## 2017-02-09 DIAGNOSIS — E291 Testicular hypofunction: Secondary | ICD-10-CM | POA: Diagnosis not present

## 2017-03-13 DIAGNOSIS — E291 Testicular hypofunction: Secondary | ICD-10-CM | POA: Diagnosis not present

## 2017-04-05 DIAGNOSIS — N3001 Acute cystitis with hematuria: Secondary | ICD-10-CM | POA: Diagnosis not present

## 2017-04-05 DIAGNOSIS — B9620 Unspecified Escherichia coli [E. coli] as the cause of diseases classified elsewhere: Secondary | ICD-10-CM | POA: Diagnosis not present

## 2017-04-05 DIAGNOSIS — N390 Urinary tract infection, site not specified: Secondary | ICD-10-CM | POA: Diagnosis not present

## 2017-04-10 DIAGNOSIS — E291 Testicular hypofunction: Secondary | ICD-10-CM | POA: Diagnosis not present

## 2017-05-11 DIAGNOSIS — R310 Gross hematuria: Secondary | ICD-10-CM | POA: Diagnosis not present

## 2017-05-12 DIAGNOSIS — E7800 Pure hypercholesterolemia, unspecified: Secondary | ICD-10-CM | POA: Diagnosis not present

## 2017-05-12 DIAGNOSIS — E291 Testicular hypofunction: Secondary | ICD-10-CM | POA: Diagnosis not present

## 2017-05-12 DIAGNOSIS — E119 Type 2 diabetes mellitus without complications: Secondary | ICD-10-CM | POA: Diagnosis not present

## 2017-05-12 DIAGNOSIS — I10 Essential (primary) hypertension: Secondary | ICD-10-CM | POA: Diagnosis not present

## 2017-05-12 DIAGNOSIS — Z125 Encounter for screening for malignant neoplasm of prostate: Secondary | ICD-10-CM | POA: Diagnosis not present

## 2017-05-12 DIAGNOSIS — J209 Acute bronchitis, unspecified: Secondary | ICD-10-CM | POA: Diagnosis not present

## 2017-06-08 DIAGNOSIS — E291 Testicular hypofunction: Secondary | ICD-10-CM | POA: Diagnosis not present

## 2017-07-07 DIAGNOSIS — E291 Testicular hypofunction: Secondary | ICD-10-CM | POA: Diagnosis not present

## 2017-08-07 DIAGNOSIS — E291 Testicular hypofunction: Secondary | ICD-10-CM | POA: Diagnosis not present

## 2017-09-06 DIAGNOSIS — E291 Testicular hypofunction: Secondary | ICD-10-CM | POA: Diagnosis not present

## 2017-10-06 DIAGNOSIS — E291 Testicular hypofunction: Secondary | ICD-10-CM | POA: Diagnosis not present

## 2017-11-06 DIAGNOSIS — E291 Testicular hypofunction: Secondary | ICD-10-CM | POA: Diagnosis not present

## 2017-11-22 DIAGNOSIS — E119 Type 2 diabetes mellitus without complications: Secondary | ICD-10-CM | POA: Diagnosis not present

## 2017-11-22 DIAGNOSIS — I10 Essential (primary) hypertension: Secondary | ICD-10-CM | POA: Diagnosis not present

## 2017-11-22 DIAGNOSIS — E291 Testicular hypofunction: Secondary | ICD-10-CM | POA: Diagnosis not present

## 2017-12-07 DIAGNOSIS — E291 Testicular hypofunction: Secondary | ICD-10-CM | POA: Diagnosis not present

## 2018-01-08 DIAGNOSIS — E291 Testicular hypofunction: Secondary | ICD-10-CM | POA: Diagnosis not present

## 2018-02-07 DIAGNOSIS — E291 Testicular hypofunction: Secondary | ICD-10-CM | POA: Diagnosis not present

## 2018-03-12 DIAGNOSIS — E291 Testicular hypofunction: Secondary | ICD-10-CM | POA: Diagnosis not present

## 2018-04-12 DIAGNOSIS — E291 Testicular hypofunction: Secondary | ICD-10-CM | POA: Diagnosis not present

## 2018-05-14 DIAGNOSIS — E291 Testicular hypofunction: Secondary | ICD-10-CM | POA: Diagnosis not present

## 2018-05-29 DIAGNOSIS — K219 Gastro-esophageal reflux disease without esophagitis: Secondary | ICD-10-CM | POA: Diagnosis not present

## 2018-05-29 DIAGNOSIS — I10 Essential (primary) hypertension: Secondary | ICD-10-CM | POA: Diagnosis not present

## 2018-05-29 DIAGNOSIS — G4700 Insomnia, unspecified: Secondary | ICD-10-CM | POA: Diagnosis not present

## 2018-05-29 DIAGNOSIS — E119 Type 2 diabetes mellitus without complications: Secondary | ICD-10-CM | POA: Diagnosis not present

## 2018-06-12 DIAGNOSIS — E291 Testicular hypofunction: Secondary | ICD-10-CM | POA: Diagnosis not present

## 2018-06-12 DIAGNOSIS — I10 Essential (primary) hypertension: Secondary | ICD-10-CM | POA: Diagnosis not present

## 2018-07-04 DIAGNOSIS — E291 Testicular hypofunction: Secondary | ICD-10-CM | POA: Diagnosis not present

## 2018-07-25 DIAGNOSIS — E291 Testicular hypofunction: Secondary | ICD-10-CM | POA: Diagnosis not present

## 2018-08-15 DIAGNOSIS — E291 Testicular hypofunction: Secondary | ICD-10-CM | POA: Diagnosis not present

## 2018-08-30 DIAGNOSIS — E119 Type 2 diabetes mellitus without complications: Secondary | ICD-10-CM | POA: Diagnosis not present

## 2018-08-30 DIAGNOSIS — K219 Gastro-esophageal reflux disease without esophagitis: Secondary | ICD-10-CM | POA: Diagnosis not present

## 2018-08-30 DIAGNOSIS — I10 Essential (primary) hypertension: Secondary | ICD-10-CM | POA: Diagnosis not present

## 2018-08-30 DIAGNOSIS — F419 Anxiety disorder, unspecified: Secondary | ICD-10-CM | POA: Diagnosis not present

## 2018-09-05 DIAGNOSIS — E291 Testicular hypofunction: Secondary | ICD-10-CM | POA: Diagnosis not present

## 2018-09-27 DIAGNOSIS — E291 Testicular hypofunction: Secondary | ICD-10-CM | POA: Diagnosis not present

## 2018-10-19 DIAGNOSIS — E291 Testicular hypofunction: Secondary | ICD-10-CM | POA: Diagnosis not present

## 2018-11-08 DIAGNOSIS — E291 Testicular hypofunction: Secondary | ICD-10-CM | POA: Diagnosis not present

## 2018-11-28 DIAGNOSIS — E291 Testicular hypofunction: Secondary | ICD-10-CM | POA: Diagnosis not present

## 2018-11-28 DIAGNOSIS — F419 Anxiety disorder, unspecified: Secondary | ICD-10-CM | POA: Diagnosis not present

## 2018-11-28 DIAGNOSIS — N183 Chronic kidney disease, stage 3 (moderate): Secondary | ICD-10-CM | POA: Diagnosis not present

## 2018-11-28 DIAGNOSIS — E119 Type 2 diabetes mellitus without complications: Secondary | ICD-10-CM | POA: Diagnosis not present

## 2018-11-28 DIAGNOSIS — K219 Gastro-esophageal reflux disease without esophagitis: Secondary | ICD-10-CM | POA: Diagnosis not present

## 2018-11-28 DIAGNOSIS — I10 Essential (primary) hypertension: Secondary | ICD-10-CM | POA: Diagnosis not present

## 2018-12-19 DIAGNOSIS — E291 Testicular hypofunction: Secondary | ICD-10-CM | POA: Diagnosis not present

## 2019-01-10 DIAGNOSIS — E291 Testicular hypofunction: Secondary | ICD-10-CM | POA: Diagnosis not present

## 2019-01-31 DIAGNOSIS — E291 Testicular hypofunction: Secondary | ICD-10-CM | POA: Diagnosis not present

## 2019-02-21 DIAGNOSIS — E291 Testicular hypofunction: Secondary | ICD-10-CM | POA: Diagnosis not present

## 2019-03-14 DIAGNOSIS — E291 Testicular hypofunction: Secondary | ICD-10-CM | POA: Diagnosis not present

## 2019-04-22 DIAGNOSIS — Z20828 Contact with and (suspected) exposure to other viral communicable diseases: Secondary | ICD-10-CM | POA: Diagnosis not present

## 2019-04-24 DIAGNOSIS — E291 Testicular hypofunction: Secondary | ICD-10-CM | POA: Diagnosis not present

## 2019-05-15 DIAGNOSIS — E291 Testicular hypofunction: Secondary | ICD-10-CM | POA: Diagnosis not present

## 2019-06-10 DIAGNOSIS — Z125 Encounter for screening for malignant neoplasm of prostate: Secondary | ICD-10-CM | POA: Diagnosis not present

## 2019-06-10 DIAGNOSIS — I10 Essential (primary) hypertension: Secondary | ICD-10-CM | POA: Diagnosis not present

## 2019-06-10 DIAGNOSIS — E7800 Pure hypercholesterolemia, unspecified: Secondary | ICD-10-CM | POA: Diagnosis not present

## 2019-06-10 DIAGNOSIS — E119 Type 2 diabetes mellitus without complications: Secondary | ICD-10-CM | POA: Diagnosis not present

## 2019-06-10 DIAGNOSIS — E291 Testicular hypofunction: Secondary | ICD-10-CM | POA: Diagnosis not present

## 2019-07-01 DIAGNOSIS — E291 Testicular hypofunction: Secondary | ICD-10-CM | POA: Diagnosis not present

## 2019-07-23 DIAGNOSIS — E291 Testicular hypofunction: Secondary | ICD-10-CM | POA: Diagnosis not present

## 2019-08-14 DIAGNOSIS — E291 Testicular hypofunction: Secondary | ICD-10-CM | POA: Diagnosis not present

## 2019-08-30 DIAGNOSIS — J019 Acute sinusitis, unspecified: Secondary | ICD-10-CM | POA: Diagnosis not present

## 2019-09-04 DIAGNOSIS — E291 Testicular hypofunction: Secondary | ICD-10-CM | POA: Diagnosis not present

## 2019-09-24 DIAGNOSIS — E291 Testicular hypofunction: Secondary | ICD-10-CM | POA: Diagnosis not present

## 2019-10-15 DIAGNOSIS — E291 Testicular hypofunction: Secondary | ICD-10-CM | POA: Diagnosis not present

## 2019-11-05 DIAGNOSIS — E291 Testicular hypofunction: Secondary | ICD-10-CM | POA: Diagnosis not present

## 2019-11-26 DIAGNOSIS — E291 Testicular hypofunction: Secondary | ICD-10-CM | POA: Diagnosis not present

## 2019-12-11 DIAGNOSIS — N183 Chronic kidney disease, stage 3 unspecified: Secondary | ICD-10-CM | POA: Diagnosis not present

## 2019-12-11 DIAGNOSIS — I10 Essential (primary) hypertension: Secondary | ICD-10-CM | POA: Diagnosis not present

## 2019-12-11 DIAGNOSIS — E7800 Pure hypercholesterolemia, unspecified: Secondary | ICD-10-CM | POA: Diagnosis not present

## 2019-12-11 DIAGNOSIS — R011 Cardiac murmur, unspecified: Secondary | ICD-10-CM | POA: Diagnosis not present

## 2019-12-11 DIAGNOSIS — E291 Testicular hypofunction: Secondary | ICD-10-CM | POA: Diagnosis not present

## 2019-12-11 DIAGNOSIS — E119 Type 2 diabetes mellitus without complications: Secondary | ICD-10-CM | POA: Diagnosis not present

## 2019-12-17 DIAGNOSIS — E291 Testicular hypofunction: Secondary | ICD-10-CM | POA: Diagnosis not present

## 2020-01-01 DIAGNOSIS — R011 Cardiac murmur, unspecified: Secondary | ICD-10-CM | POA: Diagnosis not present

## 2020-01-06 DIAGNOSIS — H2513 Age-related nuclear cataract, bilateral: Secondary | ICD-10-CM | POA: Diagnosis not present

## 2020-01-06 DIAGNOSIS — E1339 Other specified diabetes mellitus with other diabetic ophthalmic complication: Secondary | ICD-10-CM | POA: Diagnosis not present

## 2020-01-06 DIAGNOSIS — Z7984 Long term (current) use of oral hypoglycemic drugs: Secondary | ICD-10-CM | POA: Diagnosis not present

## 2020-01-06 DIAGNOSIS — E113293 Type 2 diabetes mellitus with mild nonproliferative diabetic retinopathy without macular edema, bilateral: Secondary | ICD-10-CM | POA: Diagnosis not present

## 2020-01-07 DIAGNOSIS — E291 Testicular hypofunction: Secondary | ICD-10-CM | POA: Diagnosis not present

## 2020-01-27 NOTE — Progress Notes (Signed)
Cardiology Office Note:   Date:  01/30/2020  NAME:  Bobby Torres    MRN: [id number] DOB:  08-Oct-1955   PCP:  Shirline Frees, MD  Cardiologist:  No primary care provider on file.   Referring MD: Shirline Frees, MD   Chief Complaint  Patient presents with  . Heart Murmur   History of Present Illness:   Bobby Torres is a 64 y.o. male with a hx of DM, HTN who is being seen today for the evaluation of aortic stenosis at the request of Shirline Frees, MD.  He reports he was recently noted to have a murmur by his physician.  Apparently there was an echocardiogram ordered at [facility].  That echocardiogram demonstrated moderate aortic stenosis.  There is no comment on valve morphology such as tricuspid or bicuspid.  He does report that he has had a murmur his entire life.  Per the report the V-max was 3.3 m/s with a mean gradient of 30 mmHg.  The aortic valve area is erroneously incorrect.  Ejection fraction was noted to be 65 to 70%.  They do not comment on any grade of diastolic dysfunction and report there is possible diastolic dysfunction present.  They also commented on a mildly dilated aorta with measurement 39 mm.  There is no other significant valvular heart disease.  RV systolic pressure was normal.  He reports for the past 2 to 3 months he is getting tightness in his chest when he exerts himself.  Apparently when he does any strenuous activity he can get a little tightness in his chest.  Symptoms last seconds and resolved with cessation of activity.  He is never had a heart attack or stroke.  He did have a left heart catheterization in 2012 as part of a abnormal nuclear medicine stress test that was normal.  Apparently this was a false positive test.  I did review his CT PE study from 2017 that demonstrated mild coronary calcifications.  His EKG today demonstrates normal sinus rhythm with frequent PACs.  He reports he has no heart palpitations.  He also can get short of breath when he  does any heavy exertion.  His wife presents with him today.  There is no structured exercise.  He reports and she reports that he mainly sits around the house.  He does do some mild yard work without any major limitations but activities such as climbing stairs can give him some chest tightness and shortness of breath.  Symptoms appear to be inconsistent.  He was given an inhaler with no improvement in symptoms.  Diet is poor.  Family history is significant for father who had a stroke.  He does not use tobacco.  He is never smoker.  He does drink up to 2 glasses of wine per night.  He is no longer working.  He works as a Mudlogger in the past.  A1c and lipid profile below.  Problem List 1. HTN 2. HLD -Total cholesterol 132, HDL 46, LDL 68, triglycerides 94 3. DM -A1c 8.6 4. CKD3  Past Medical History: Past Medical History:  Diagnosis Date  . Anxiety   . Diabetes mellitus without complication ([place])   . GERD (gastroesophageal reflux disease)   . Heart murmur   . Hyperlipidemia   . Hypertension   . Kidney stones   . Rotator cuff strain     Past Surgical History: Past Surgical History:  Procedure Laterality Date  . BACK SURGERY  x 2  . EYE SURGERY    . SHOULDER ARTHROSCOPY WITH SUBACROMIAL DECOMPRESSION Right 03/30/2015   Procedure: SHOULDER ARTHROSCOPY WITH SUBACROMIAL DECOMPRESSION Debridment and capsular release;  Surgeon: Tania Ade, MD;  Location: [facility];  Service: Orthopedics;  Laterality: Right;  Capsular release    Current Medications: Current Meds  Medication Sig  . atorvastatin (LIPITOR) 40 MG tablet Take 40 mg by mouth daily.  . clonazePAM (KLONOPIN) 1 MG tablet Take 1 mg by mouth at bedtime.  . diphenhydramine-acetaminophen (TYLENOL PM) 25-500 MG TABS tablet Take 0.5 tablets by mouth at bedtime as needed (sleep/pain).  Marland Kitchen empagliflozin (JARDIANCE) 10 MG TABS tablet Take 10 mg by mouth daily.  Marland Kitchen glipiZIDE (GLUCOTROL) 10 MG  tablet Take 10 mg by mouth 2 (two) times daily before a meal.  . glucosamine-chondroitin 500-400 MG tablet Take 2 tablets by mouth daily.  . Ibuprofen-Diphenhydramine Cit (ADVIL PM PO) Take 0.5 tablets by mouth at bedtime as needed (pain/sleep).  Marland Kitchen lisinopril-hydrochlorothiazide (PRINZIDE,ZESTORETIC) 20-12.5 MG tablet Take 1 tablet by mouth daily.  . metFORMIN (GLUCOPHAGE-XR) 500 MG 24 hr tablet Take 1,000 mg by mouth daily.  . Omega-3 Fatty Acids (FISH OIL PO) Take 2 capsules by mouth daily.  . pantoprazole (PROTONIX) 40 MG tablet Take 40 mg by mouth daily.  . pioglitazone (ACTOS) 30 MG tablet Take 30 mg by mouth daily.     Allergies:    Patient has no known allergies.   Social History: Social History   Socioeconomic History  . Marital status: Married    Spouse name: Not on file  . Number of children: 2  . Years of education: Not on file  . Highest education level: Not on file  Occupational History  . Occupation: Scientist, research (physical sciences)  Tobacco Use  . Smoking status: Never Smoker  . Smokeless tobacco: Never Used  Substance and Sexual Activity  . Alcohol use: Yes    Comment: wine occasionally  . Drug use: No  . Sexual activity: Not on file  Other Topics Concern  . Not on file  Social History Narrative  . Not on file   Social Determinants of Health   Financial Resource Strain:   . Difficulty of Paying Living Expenses: Not on file  Food Insecurity:   . Worried About Charity fundraiser in the Last Year: Not on file  . Ran Out of Food in the Last Year: Not on file  Transportation Needs:   . Lack of Transportation (Medical): Not on file  . Lack of Transportation (Non-Medical): Not on file  Physical Activity:   . Days of Exercise per Week: Not on file  . Minutes of Exercise per Session: Not on file  Stress:   . Feeling of Stress : Not on file  Social Connections:   . Frequency of Communication with Friends and Family: Not on file  . Frequency of Social Gatherings with Friends and  Family: Not on file  . Attends Religious Services: Not on file  . Active Member of Clubs or Organizations: Not on file  . Attends Archivist Meetings: Not on file  . Marital Status: Not on file     Family History: The patient's family history includes Heart disease in his paternal grandfather; Stroke in his father.  ROS:   All other ROS reviewed and negative. Pertinent positives noted in the HPI.     EKGs/Labs/Other Studies Reviewed:   The following studies were personally reviewed by me today:  EKG:  EKG is  ordered today.  The ekg ordered today demonstrates normal sinus rhythm, heart rate 88, PACs noted, and was personally reviewed by me.   Recent Labs: No results found for requested labs within last 8760 hours.   Recent Lipid Panel No results found for: CHOL, TRIG, HDL, CHOLHDL, VLDL, LDLCALC, LDLDIRECT  Physical Exam:   VS:  BP (!) 134/58   Pulse 88   Ht _0  (1.854 m)   Wt 259 lb 6.4 oz (117.7 kg)   SpO2 99%   BMI 34.22 kg/m    Wt Readings from Last 3 Encounters:  01/30/20 259 lb 6.4 oz (117.7 kg)  03/30/16 268 lb (121.6 kg)  03/30/15 276 lb (125.2 kg)    General: Well nourished, well developed, in no acute distress Heart: Atraumatic, normal size  Eyes: PEERLA, EOMI  Neck: Supple, no JVD Endocrine: No thryomegaly Cardiac: Normal S1, S2; 3 out of 6 systolic ejection murmur, early peaking, radiates into carotids Lungs: Clear to auscultation bilaterally, no wheezing, rhonchi or rales  Abd: Soft, nontender, no hepatomegaly  Ext: No edema, pulses 2+ Musculoskeletal: No deformities, BUE and BLE strength normal and equal Skin: Warm and dry, no rashes   Neuro: Alert and oriented to person, place, time, and situation, CNII-XII grossly intact, no focal deficits  Psych: Normal mood and affect   ASSESSMENT:   Bobby Torres is a 64 y.o. male who presents for the following: 1. Murmur, cardiac   2. Nonrheumatic aortic valve stenosis   3. Chest pain of uncertain  etiology   4. PAC (premature atrial contraction)   5. SOB (shortness of breath)   6. Other hyperlipidemia   7. Primary hypertension     PLAN:   1. Murmur, cardiac 2. Nonrheumatic aortic valve stenosis -Review of echo report obtained recently through [facility] demonstrates moderate aortic stenosis.  No comment on valve morphology.  Just demonstrates moderate aortic stenosis with a V-max of 3.3 m/s and mean gradient 30 mmHg.  Aorta mildly dilated 39 mm.  Aortic valve area is incorrect.  No LVOT measurements are reported.  I cannot see any of the images. -Murmur also consistent with moderate disease.  I do not think this explains his symptoms.  We will pursue a CT scan we can get an aortic valve calcium score discussed below.  3. Chest pain of uncertain etiology 4. PAC (premature atrial contraction) 5. SOB (shortness of breath) -Several months of shortness of breath and chest tightness.  Symptoms occur with exertion.  Normal left heart cath in 2012.  EKG without acute ischemic changes but frequent PACs.  Echocardiogram with normal LV function recently.  No significant diastolic dysfunction commented on. -He has no evidence of volume overload on exam today.  I would like to proceed with a coronary CTA.  We will give him 100 mg of metoprolol tartrate to our before the scan.  We will need to check his kidney panel today.  Has had some CKD in the past.  He may need hydration before the scan.  I would also like to check a BNP to exclude congestive heart failure. -Symptoms could just be due to deconditioning.  Need to exclude any significant CAD or heart failure. -He also has frequent PACs.  I would like for him to wear a monitor for 1 week to exclude atrial fibrillation. -No concerns for sleep apnea per the report. -Recent CT PE study in 2017 with mild coronary calcifications.  I think you will be good for coronary CTA. -I  do not think moderate aortic stenosis explains his symptoms.  We can get  an aortic valve calcium score with his scan.  6. Other hyperlipidemia -Most recent LDL cholesterol at goal.  Continue Lipitor 40 mg daily.  7. Primary hypertension -BP well controlled.  Continue current medications.  Disposition: Return in about 3 months (around 05/01/2020).  Medication Adjustments/Labs and Tests Ordered: Current medicines are reviewed at length with the patient today.  Concerns regarding medicines are outlined above.  Orders Placed This Encounter  Procedures  . CT CORONARY MORPH W/CTA COR W/SCORE W/CA W/CM &/OR WO/CM  . CT CORONARY FRACTIONAL FLOW RESERVE DATA PREP  . CT CORONARY FRACTIONAL FLOW RESERVE FLUID ANALYSIS  . Basic metabolic panel  . Brain natriuretic peptide  . LONG TERM MONITOR (3-14 DAYS)   Meds ordered this encounter  Medications  . metoprolol tartrate (LOPRESSOR) 100 MG tablet    Sig: Take 100 mg (1 tablet) two hours prior to CT    Dispense:  1 tablet    Refill:  0    Patient Instructions  Medication Instructions:  Your physician recommends that you continue on your current medications as directed. Please refer to the Current Medication list given to you today.  *If you need a refill on your cardiac medications before your next appointment, please call your pharmacy*   Lab Work: BMET, BNP today  If you have labs (blood work) drawn today and your tests are completely normal, you will receive your results only by: Marland Kitchen MyChart Message (if you have MyChart) OR . A paper copy in the mail If you have any lab test that is abnormal or we need to change your treatment, we will call you to review the results.   Testing/Procedures: Coronary CTA-see instructions below   ZIO XT- Long Term Monitor Instructions   Your physician has requested you wear your ZIO patch monitor 7 days.   This is a single patch monitor.  [employer] supplies one patch monitor per enrollment.  Additional stickers are not available.   Please do not apply patch if you will  be having a Nuclear Stress Test, Echocardiogram, Cardiac CT, MRI, or Chest Xray during the time frame you would be wearing the monitor. The patch cannot be worn during these tests.  You cannot remove and re-apply the ZIO XT patch monitor.   Your ZIO patch monitor will be sent USPS Priority mail from [facility] directly to your home address. The monitor may also be mailed to a PO BOX if home delivery is not available.   It may take 3-5 days to receive your monitor after you have been enrolled.   Once you have received you monitor, please review enclosed instructions.  Your monitor has already been registered assigning a specific monitor serial # to you.   Applying the monitor   Shave hair from upper left chest.   Hold abrader disc by orange tab.  Rub abrader in 40 strokes over left upper chest as indicated in your monitor instructions.   Clean area with 4 enclosed alcohol pads .  Use all pads to assure are is cleaned thoroughly.  Let dry.   Apply patch as indicated in monitor instructions.  Patch will be place under collarbone on left side of chest with arrow pointing upward.   Rub patch adhesive wings for 2 minutes.Remove white label marked "1".  Remove white label marked "2".  Rub patch adhesive wings for 2 additional minutes.   While looking in a mirror, press  and release button in center of patch.  A small green light will flash 3-4 times .  This will be your only indicator the monitor has been turned on.     Do not shower for the first 24 hours.  You may shower after the first 24 hours.   Press button if you feel a symptom. You will hear a small click.  Record Date, Time and Symptom in the Patient Log Book.   When you are ready to remove patch, follow instructions on last 2 pages of Patient Log Book.  Stick patch monitor onto last page of Patient Log Book.   Place Patient Log Book in [facility] box.  Use locking tab on box and tape box closed securely.  The [employer] has  [employer] on it.  Please place in mailbox as soon as possible.  Your physician should have your test results approximately 7 days after the monitor has been mailed back to [facility]   Call [facility] at [phone] if you have questions regarding your ZIO XT patch monitor.  Call them immediately if you see an orange light blinking on your monitor.   If your monitor falls off in less than 4 days contact our Monitor department at [phone].  If your monitor becomes loose or falls off after 4 days call [employer] at [phone] for suggestions on securing your monitor.   Follow-Up: At [facility], you and your health needs are our priority.  As part of our continuing mission to provide you with exceptional heart care, we have created designated Provider Care Teams.  These Care Teams include your primary Cardiologist (physician) and Advanced Practice Providers (APPs -  Physician Assistants and Nurse Practitioners) who all work together to provide you with the care you need, when you need it.  We recommend signing up for the patient portal called "MyChart".  Sign up information is provided on this After Visit Summary.  MyChart is used to connect with patients for Virtual Visits (Telemedicine).  Patients are able to view lab/test results, encounter notes, upcoming appointments, etc.  Non-urgent messages can be sent to your provider as well.   To learn more about what you can do with MyChart, go to [email or web address].    Your next appointment:   3 month(s)  The format for your next appointment:   Virtual Visit   Provider:   Eleonore Chiquito, MD   Other Instructions Your cardiac CT will be scheduled at one of the below locations:   [facility] [address] [place], [place] [address] [phone]  [facility] [address] [place], [place] [address] [phone]  If scheduled at [facility], please arrive at the [facility] main entrance of [facility] 30 minutes prior to test start time. Proceed to the [facility] Radiology Department (first floor) to check-in and test prep.  If scheduled at [facility], please arrive 15 mins early for check-in and test prep.  Please follow these instructions carefully (unless otherwise directed):  Hold all erectile dysfunction medications at least 3 days (72 hrs) prior to test.  On the Night Before the Test: . Be sure to Drink plenty of water. . Do not consume any caffeinated/decaffeinated beverages or chocolate 12 hours prior to your test. . Do not take any antihistamines 12 hours prior to your test.  On the Day of the Test: . Drink plenty of water.  Do not drink any water within one hour of the test. . Do not eat any food 4 hours prior to the test. . You may take your regular medications prior to the test.  . Take metoprolol (Lopressor) two hours prior to test. . HOLD lisniopril/Hydrochlorothiazide morning of the test.      After the Test: . Drink plenty of water. . After receiving IV contrast, you may experience a mild flushed feeling. This is normal. . On occasion, you may experience a mild rash up to 24 hours after the test. This is not dangerous. If this occurs, you can take Benadryl 25 mg and increase your fluid intake. . If you experience trouble breathing, this can be serious. If it is severe call 911 IMMEDIATELY. If it is mild, please call our office. . If you take any of these medications: Glipizide/Metformin, Avandament, Glucavance, please do not take 48 hours after completing test unless otherwise instructed.   Once we have confirmed authorization from your insurance company, we will call you to set up a date and time for your test. Based on how quickly your insurance processes prior authorizations requests, please allow up to 4 weeks to be contacted for scheduling your Cardiac CT  appointment. Be advised that routine Cardiac CT appointments could be scheduled as many as 8 weeks after your provider has ordered it.  For non-scheduling related questions, please contact the cardiac imaging nurse navigator should you have any questions/concerns: Marchia Bond, Cardiac Imaging Nurse Navigator Burley Saver, Interim Cardiac Imaging Nurse [facility] Direct Office Dial: [phone]   For scheduling needs, including cancellations and rescheduling, please call Vivien Rota at [phone], option 3.        Signed, Addison Naegeli. Audie Box, [place]  [address] [place], [place] [address] [phone]  01/30/2020 10:21 AM

## 2020-01-28 DIAGNOSIS — E291 Testicular hypofunction: Secondary | ICD-10-CM | POA: Diagnosis not present

## 2020-01-30 ENCOUNTER — Encounter: Payer: Self-pay | Admitting: Cardiovascular Disease

## 2020-01-30 ENCOUNTER — Other Ambulatory Visit: Payer: Self-pay

## 2020-01-30 ENCOUNTER — Ambulatory Visit (INDEPENDENT_AMBULATORY_CARE_PROVIDER_SITE_OTHER): Payer: BC Managed Care – PPO | Admitting: Cardiovascular Disease

## 2020-01-30 ENCOUNTER — Telehealth: Payer: Self-pay | Admitting: Radiology

## 2020-01-30 VITALS — BP 134/58 | HR 88 | Ht 73.0 in | Wt 259.4 lb

## 2020-01-30 DIAGNOSIS — R011 Cardiac murmur, unspecified: Secondary | ICD-10-CM

## 2020-01-30 DIAGNOSIS — I10 Essential (primary) hypertension: Secondary | ICD-10-CM

## 2020-01-30 DIAGNOSIS — R0602 Shortness of breath: Secondary | ICD-10-CM

## 2020-01-30 DIAGNOSIS — I350 Nonrheumatic aortic (valve) stenosis: Secondary | ICD-10-CM | POA: Diagnosis not present

## 2020-01-30 DIAGNOSIS — R079 Chest pain, unspecified: Secondary | ICD-10-CM

## 2020-01-30 DIAGNOSIS — I491 Atrial premature depolarization: Secondary | ICD-10-CM

## 2020-01-30 DIAGNOSIS — E7849 Other hyperlipidemia: Secondary | ICD-10-CM

## 2020-01-30 LAB — BASIC METABOLIC PANEL: BUN/Creatinine Ratio: 14 (ref 10–24)

## 2020-01-30 MED ORDER — METOPROLOL TARTRATE 100 MG PO TABS: ORAL_TABLET | ORAL | 0 refills | Status: DC

## 2020-01-30 NOTE — Patient Instructions (Signed)
Medication Instructions:  Your physician recommends that you continue on your current medications as directed. Please refer to the Current Medication list given to you today.  *If you need a refill on your cardiac medications before your next appointment, please call your pharmacy*   Lab Work: BMET, BNP today  If you have labs (blood work) drawn today and your tests are completely normal, you will receive your results only by: Marland Kitchen MyChart Message (if you have MyChart) OR . A paper copy in the mail If you have any lab test that is abnormal or we need to change your treatment, we will call you to review the results.   Testing/Procedures: Coronary CTA-see instructions below   ZIO XT- Long Term Monitor Instructions   Your physician has requested you wear your ZIO patch monitor 7 days.   This is a single patch monitor.  [employer] supplies one patch monitor per enrollment.  Additional stickers are not available.   Please do not apply patch if you will be having a Nuclear Stress Test, Echocardiogram, Cardiac CT, MRI, or Chest Xray during the time frame you would be wearing the monitor. The patch cannot be worn during these tests.  You cannot remove and re-apply the ZIO XT patch monitor.   Your ZIO patch monitor will be sent USPS Priority mail from [facility] directly to your home address. The monitor may also be mailed to a PO BOX if home delivery is not available.   It may take 3-5 days to receive your monitor after you have been enrolled.   Once you have received you monitor, please review enclosed instructions.  Your monitor has already been registered assigning a specific monitor serial # to you.   Applying the monitor   Shave hair from upper left chest.   Hold abrader disc by orange tab.  Rub abrader in 40 strokes over left upper chest as indicated in your monitor instructions.   Clean area with 4 enclosed alcohol pads .  Use all pads to assure are is cleaned thoroughly.  Let  dry.   Apply patch as indicated in monitor instructions.  Patch will be place under collarbone on left side of chest with arrow pointing upward.   Rub patch adhesive wings for 2 minutes.Remove white label marked "1".  Remove white label marked "2".  Rub patch adhesive wings for 2 additional minutes.   While looking in a mirror, press and release button in center of patch.  A small green light will flash 3-4 times .  This will be your only indicator the monitor has been turned on.     Do not shower for the first 24 hours.  You may shower after the first 24 hours.   Press button if you feel a symptom. You will hear a small click.  Record Date, Time and Symptom in the Patient Log Book.   When you are ready to remove patch, follow instructions on last 2 pages of Patient Log Book.  Stick patch monitor onto last page of Patient Log Book.   Place Patient Log Book in [place] box.  Use locking tab on box and tape box closed securely.  The [employer] has [employer] on it.  Please place in mailbox as soon as possible.  Your physician should have your test results approximately 7 days after the monitor has been mailed back to [facility].   Call [place] at [phone] if you have questions regarding your ZIO XT  patch monitor.  Call them immediately if you see an orange light blinking on your monitor.   If your monitor falls off in less than 4 days contact our Monitor department at [phone].  If your monitor becomes loose or falls off after 4 days call [employer] at [phone] for suggestions on securing your monitor.   Follow-Up: At [facility], you and your health needs are our priority.  As part of our continuing mission to provide you with exceptional heart care, we have created designated Provider Care Teams.  These Care Teams include your primary Cardiologist (physician) and Advanced Practice Providers (APPs -  Physician Assistants and Nurse Practitioners)  who all work together to provide you with the care you need, when you need it.  We recommend signing up for the patient portal called "MyChart".  Sign up information is provided on this After Visit Summary.  MyChart is used to connect with patients for Virtual Visits (Telemedicine).  Patients are able to view lab/test results, encounter notes, upcoming appointments, etc.  Non-urgent messages can be sent to your provider as well.   To learn more about what you can do with MyChart, go to [email or web address].    Your next appointment:   3 month(s)  The format for your next appointment:   Virtual Visit   Provider:   Eleonore Chiquito, MD   Other Instructions Your cardiac CT will be scheduled at one of the below locations:   [facility] [address] [place], [place] [address] [phone]  [place] [address] [place], [place] [address] [phone]  If scheduled at [facility], please arrive at the [facility] main entrance of [facility] 30 minutes prior to test start time. Proceed to the [facility] Radiology Department (first floor) to check-in and test prep.  If scheduled at [facility], please arrive 15 mins early for check-in and test prep.  Please follow these instructions carefully (unless otherwise directed):  Hold all erectile dysfunction medications at least 3 days (72 hrs) prior to test.  On the Night Before the Test: . Be sure to Drink plenty of water. . Do not consume any caffeinated/decaffeinated beverages or chocolate 12 hours prior to your test. . Do not take any antihistamines 12 hours prior to your test.  On the Day of the Test: . Drink plenty of water. Do not drink any water within one hour of the test. . Do not eat any food 4 hours prior to the test. . You may take your regular medications prior to the test.  . Take metoprolol (Lopressor) two  hours prior to test. . HOLD lisniopril/Hydrochlorothiazide morning of the test.      After the Test: . Drink plenty of water. . After receiving IV contrast, you may experience a mild flushed feeling. This is normal. . On occasion, you may experience a mild rash up to 24 hours after the test. This is not dangerous. If this occurs, you can take Benadryl 25 mg and increase your fluid intake. . If you experience trouble breathing, this can be serious. If it is severe call 911 IMMEDIATELY. If it is mild, please call our office. . If you take any of these medications: Glipizide/Metformin, Avandament, Glucavance, please do not take 48 hours after completing test unless otherwise instructed.   Once we have confirmed authorization from your insurance company, we will call you to set up a date and time for  your test. Based on how quickly your insurance processes prior authorizations requests, please allow up to 4 weeks to be contacted for scheduling your Cardiac CT appointment. Be advised that routine Cardiac CT appointments could be scheduled as many as 8 weeks after your provider has ordered it.  For non-scheduling related questions, please contact the cardiac imaging nurse navigator should you have any questions/concerns: Marchia Bond, Cardiac Imaging Nurse Navigator Burley Saver, Interim Cardiac Imaging Nurse [place] [facility] Direct Office Dial: [phone]   For scheduling needs, including cancellations and rescheduling, please call Vivien Rota at [phone], option 3.

## 2020-01-30 NOTE — Telephone Encounter (Signed)
Enrolled patient for a 7 day Zio XT monitor to be mailed to patients home.  

## 2020-01-30 NOTE — Addendum Note (Signed)
Addended by: Brunetta Genera on: 01/30/2020 04:09 PM   Modules accepted: Orders

## 2020-01-31 LAB — BASIC METABOLIC PANEL
BUN: 18 mg/dL (ref 8–27)
CO2: 22 mmol/L (ref 20–29)
Calcium: 9.5 mg/dL (ref 8.6–10.2)
Chloride: 98 mmol/L (ref 96–106)
Creatinine, Ser: 1.33 mg/dL — ABNORMAL HIGH (ref 0.76–1.27)
GFR calc Af Amer: 66 mL/min/{1.73_m2} (ref >59)
GFR calc non Af Amer: 57 mL/min/{1.73_m2} — ABNORMAL LOW (ref >59)
Glucose: 162 mg/dL — ABNORMAL HIGH (ref 65–99)
Potassium: 5.3 mmol/L — ABNORMAL HIGH (ref 3.5–5.2)
Sodium: 133 mmol/L — ABNORMAL LOW (ref 134–144)

## 2020-01-31 LAB — BRAIN NATRIURETIC PEPTIDE: BNP: 11 pg/mL (ref 0.0–100.0)

## 2020-02-03 ENCOUNTER — Telehealth (HOSPITAL_COMMUNITY): Payer: Self-pay | Admitting: Emergency Medicine

## 2020-02-03 ENCOUNTER — Other Ambulatory Visit (INDEPENDENT_AMBULATORY_CARE_PROVIDER_SITE_OTHER): Payer: BC Managed Care – PPO

## 2020-02-03 DIAGNOSIS — R0602 Shortness of breath: Secondary | ICD-10-CM | POA: Diagnosis not present

## 2020-02-03 DIAGNOSIS — I491 Atrial premature depolarization: Secondary | ICD-10-CM

## 2020-02-03 NOTE — Telephone Encounter (Signed)
Reaching out to patient to offer assistance regarding upcoming cardiac imaging study; pt verbalizes understanding of appt date/time, parking situation and where to check in, pre-test NPO status and medications ordered, and verified current allergies; name and call back number provided for further questions should they arise Rockwell Alexandria RN Navigator Cardiac Imaging [facility] [phone] office [phone] cell   Pt instructed to take PO metoprolol 2 hr prior to scan. Avoiding ED medications.  Pt with zio patch that started today (mon 10/25) for 7 days. Ok to remove for CTA and replace after test complete.  [place] Dec

## 2020-02-04 ENCOUNTER — Ambulatory Visit (HOSPITAL_COMMUNITY)
Admission: RE | Admit: 2020-02-04 | Discharge: 2020-02-04 | Disposition: A | Discharge status: Home / Self Care | Payer: BC Managed Care – PPO | Source: Ambulatory Visit | Attending: Cardiovascular Disease | Admitting: Cardiovascular Disease

## 2020-02-04 DIAGNOSIS — I2510 Atherosclerotic heart disease of native coronary artery without angina pectoris: Secondary | ICD-10-CM | POA: Diagnosis not present

## 2020-02-04 DIAGNOSIS — R079 Chest pain, unspecified: Secondary | ICD-10-CM | POA: Insufficient documentation

## 2020-02-04 MED ORDER — NITROGLYCERIN 0.4 MG SL SUBL
0.8000 mg | SUBLINGUAL_TABLET | Freq: Once | SUBLINGUAL | Status: AC
  Administered 2020-02-04: 0.8 mg via SUBLINGUAL

## 2020-02-04 MED ORDER — IOHEXOL 350 MG/ML SOLN
80.0000 mL | Freq: Once | INTRAVENOUS | Status: AC | PRN
  Administered 2020-02-04: 80 mL via INTRAVENOUS

## 2020-02-04 MED ORDER — NITROGLYCERIN 0.4 MG SL SUBL
SUBLINGUAL_TABLET | SUBLINGUAL | Status: AC
  Filled 2020-02-04: qty 2

## 2020-02-05 DIAGNOSIS — I2510 Atherosclerotic heart disease of native coronary artery without angina pectoris: Secondary | ICD-10-CM | POA: Diagnosis not present

## 2020-02-06 ENCOUNTER — Telehealth: Payer: Self-pay | Admitting: Cardiovascular Disease

## 2020-02-06 MED ORDER — ASPIRIN EC 81 MG PO TBEC: 81.0000 mg | DELAYED_RELEASE_TABLET | Freq: Every day | ORAL | 3 refills | Status: AC

## 2020-02-06 NOTE — Telephone Encounter (Signed)
Patient requesting a call back with his CT and lab results.

## 2020-02-06 NOTE — Telephone Encounter (Signed)
I spoke with patient and his wife and reviewed CT results and lab results from 10/21 with them.  Patient is already taking ASA 81 mg daily and is aware to continue.  Will add to his med list

## 2020-02-12 DIAGNOSIS — Z794 Long term (current) use of insulin: Secondary | ICD-10-CM | POA: Diagnosis not present

## 2020-02-12 DIAGNOSIS — E119 Type 2 diabetes mellitus without complications: Secondary | ICD-10-CM | POA: Diagnosis not present

## 2020-02-12 DIAGNOSIS — E291 Testicular hypofunction: Secondary | ICD-10-CM | POA: Diagnosis not present

## 2020-02-12 DIAGNOSIS — M25571 Pain in right ankle and joints of right foot: Secondary | ICD-10-CM | POA: Diagnosis not present

## 2020-02-18 DIAGNOSIS — R0602 Shortness of breath: Secondary | ICD-10-CM | POA: Diagnosis not present

## 2020-02-18 DIAGNOSIS — I491 Atrial premature depolarization: Secondary | ICD-10-CM | POA: Diagnosis not present

## 2020-03-03 ENCOUNTER — Telehealth: Payer: Self-pay | Admitting: *Deleted

## 2020-03-03 NOTE — Telephone Encounter (Signed)
-----   Message from Sande Rives, MD sent at 02/24/2020  9:15 AM EST ----- Please call in metoprolol succinate 25 mg daily. -W

## 2020-03-03 NOTE — Telephone Encounter (Signed)
Pt aware of his Monitor result, pt stated he is in process now of moving to [place] and he will give our office a call back as soon as he figure out which pharmacy he will be using so the medication can be send there.

## 2020-03-18 DIAGNOSIS — L03116 Cellulitis of left lower limb: Secondary | ICD-10-CM | POA: Diagnosis not present

## 2020-03-23 ENCOUNTER — Telehealth: Payer: Self-pay | Admitting: Cardiovascular Disease

## 2020-03-23 MED ORDER — METOPROLOL SUCCINATE ER 25 MG PO TB24: 25.0000 mg | ORAL_TABLET | Freq: Every day | ORAL | 1 refills | Status: AC

## 2020-03-23 NOTE — Telephone Encounter (Signed)
Spoke with patient and advised Rx sent as requested  Toprol 25 mg daily ordered on result note for monitor

## 2020-03-23 NOTE — Telephone Encounter (Signed)
Patient calling back stating he would like his metoprolol succinate to be sent to [facility] on [address]. Patient spoke with Nya about getting the prescription in regards to his monitor results.

## 2020-03-24 DIAGNOSIS — E119 Type 2 diabetes mellitus without complications: Secondary | ICD-10-CM | POA: Diagnosis not present

## 2020-03-25 DIAGNOSIS — L0390 Cellulitis, unspecified: Secondary | ICD-10-CM | POA: Diagnosis not present

## 2020-05-01 ENCOUNTER — Telehealth: Payer: BC Managed Care – PPO | Admitting: Cardiovascular Disease

## 2021-04-10 IMAGING — CT CT HEART MORP W/ CTA COR W/ SCORE W/ CA W/CM &/OR W/O CM
4 of 7 series · 8 of 20 positions shown, 9 images · IV contrast (APPLIED)
Comparison: None.
COMPARISON: None.

Addendum:
EXAM:
OVER-READ INTERPRETATION  CT CHEST

The following report is an over-read performed by radiologist Dr.
Meenu Samaniego [REDACTED] on 02/04/2020. This
over-read does not include interpretation of cardiac or coronary
anatomy or pathology. The coronary calcium score/coronary CTA
interpretation by the cardiologist is attached.
CLINICAL DATA: 63M with chest pain
Cardiac/Coronary CTA
TECHNIQUE: The patient was scanned on a [employer] Force scanner.

[Series 6: best diast 73 % · axial · 0.39mm/px · z∈[-214,-165]mm · 2 of 368 slices shown, 3 images]
[im 123/368  vessel]
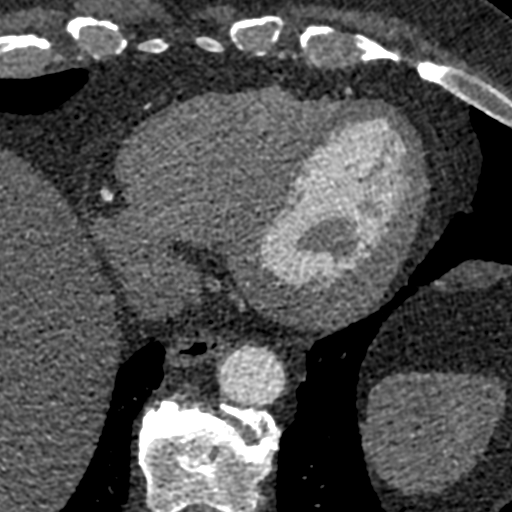
[im 123/368  lung]
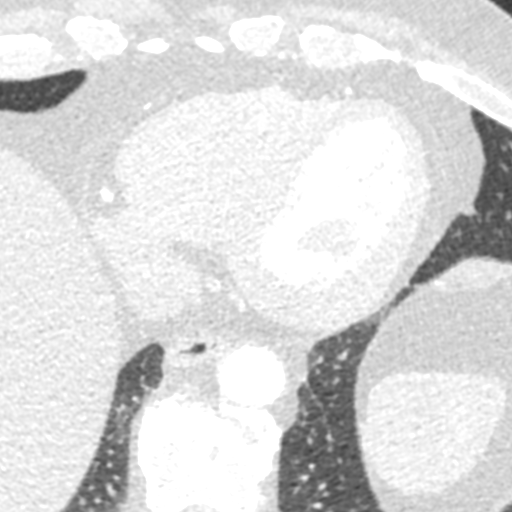
[im 245/368  vessel]
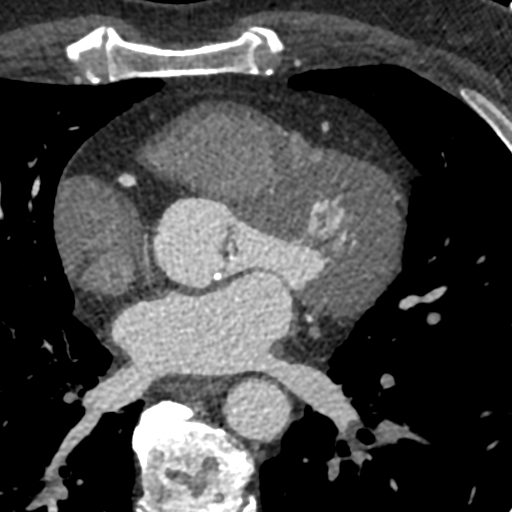

[Series 7: best syst 33 % · axial · 0.39mm/px · z∈[-214,-165]mm · 2 of 368 slices shown]
[im 123/368  vessel]
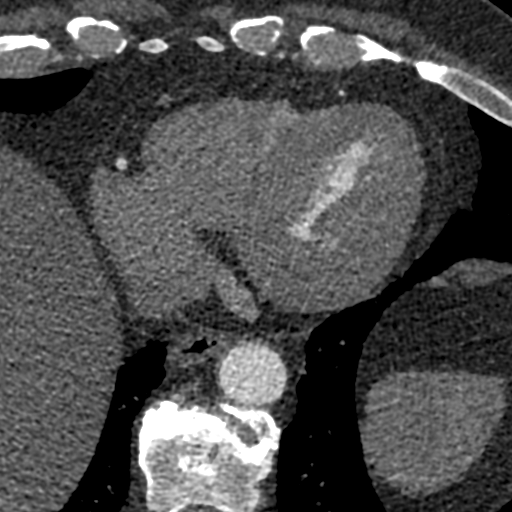
[im 245/368  vessel]
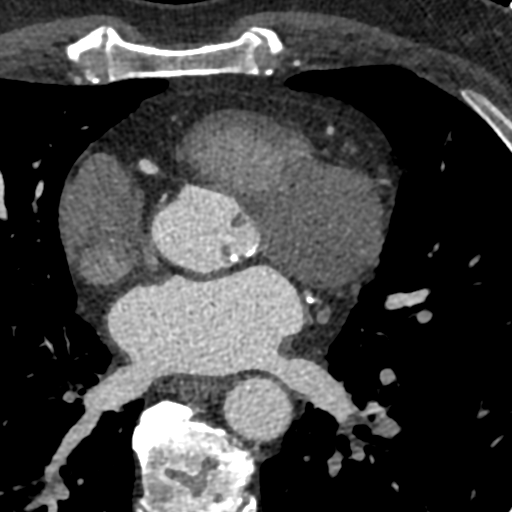

[Series 8: ts diast sharp 73 % · axial · 0.39mm/px · z∈[-214,-165]mm · 2 of 368 slices shown]
[im 123/368  lung]
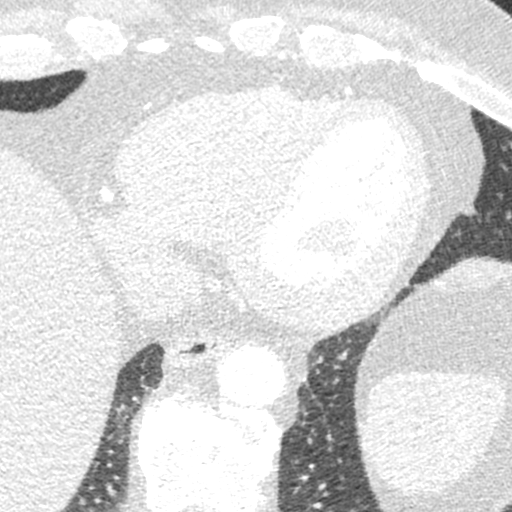
[im 245/368  lung]
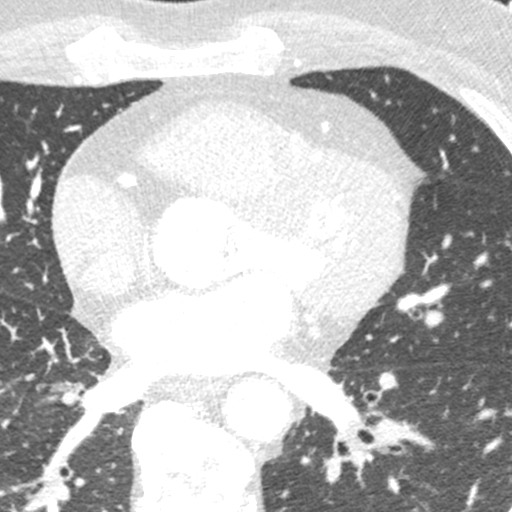

[Series 9: ts syst sharp 33 % · axial · 0.39mm/px · z∈[-214,-165]mm · 2 of 368 slices shown]
[im 123/368  lung]
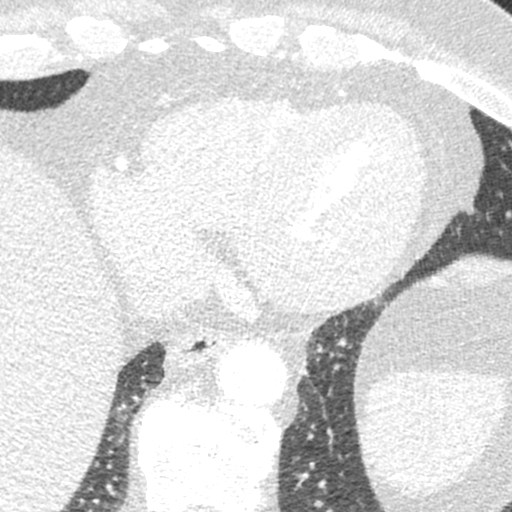
[im 245/368  lung]
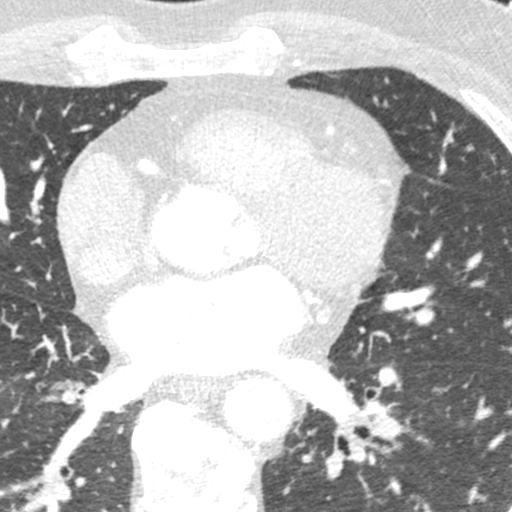

[8 of 20 positions shown; findings below may reference images not displayed]

FINDINGS: 5 mm pulmonary nodule in the right lower lobe (axial image 30 of
series 12), stable compared to prior study from 03/30/2016,
considered benign. Small calcified granulomas also noted in the
visualized right lung. Within the visualized portions of the thorax
there are no other larger more suspicious appearing pulmonary
nodules or masses, there is no acute consolidative airspace disease,
no pleural effusions, no pneumothorax and no lymphadenopathy.
Visualized portions of the upper abdomen are unremarkable. There are
no aggressive appearing lytic or blastic lesions noted in the
visualized portions of the skeleton.
IMPRESSION: 1. No significant incidental noncardiac findings are noted.
FINDINGS: A 100 kV prospective scan was triggered in the descending thoracic
aorta at 111 HU's. Axial non-contrast 3 mm slices were carried out
through the heart. The data set was analyzed on a dedicated work
station and scored using the Agatson method. Gantry rotation speed
was 250 msecs and collimation was .6 mm. 0.8 mg of sl NTG was given.
The 3D data set was reconstructed in 5% intervals of the R-R cycle.
Phases were analyzed on a dedicated work station using MPR, MIP and
VRT modes. The patient received 80 cc of contrast.

Coronary Arteries:  Normal coronary origin.  Right dominance.

RCA is a large dominant artery that gives rise to PDA and PLA. There
is calcified plaques throughout the RCA causing 0-24% stenosis

Left main is a large artery that gives rise to LAD and LCX arteries.

LAD is a large vessel. There is calcified plaque in the proximal to
mid LAD causing 0-24% stenosis. There is calcified plaque in the
distal LAD causing 50-69% stenosis. CT FFR across lesion is 0.76.
There is a large D1 branch with calcified plaque causing 25-49%
stenosis. CT FFR 0.85 across lesion.

LCX is a non-dominant artery. There is calcified plaque in the
proximal to mid LCX causing 25-49% stenosis. CT FFR 0.88 across
lesion.

Other findings:

Left Ventricle: Mild enlargement

Left Atrium: Mild enlargement

Pulmonary Veins: Normal configuration

Right Ventricle: Mild dilatation

Right Atrium: Normal size

Cardiac valves: Moderate AV calcifications

Thoracic aorta: Mild dilatation of ascending aorta measuring 36mm

Pulmonary Arteries: Normal size

Systemic Veins: Normal drainage

Pericardium: Normal thickness
IMPRESSION: 1. Coronary calcium score of 889. This was 94th percentile for age
and sex matched control.

2. Normal coronary origin with right dominance.

3. Calcified plaque in the distal LAD causes moderate (50-69%)
stenosis. CT FFR across lesion is 0.76, suggesting functionally
significant lesion.

4. Calcified plaque in large D1 branch causes mild (25-49%)
stenosis. CT FFR 0.85 across lesion, suggesting no functional
significance.

5. Calcified plaque in proximal to mid LCX causes mild (25-49%)
stenosis. CT FFR 0.88 across lesion, suggesting no functional
significance.

4. Moderate aortic valve calcifications (AV calcium score 6346)

CAD-RADS 3. Moderate stenosis. Consider symptom-guided anti-ischemic
pharmacotherapy as well as risk factor modification per guideline
directed care.

*** End of Addendum ***
EXAM:
OVER-READ INTERPRETATION  CT CHEST

The following report is an over-read performed by radiologist Dr.
Meenu Samaniego [REDACTED] on 02/04/2020. This
over-read does not include interpretation of cardiac or coronary
anatomy or pathology. The coronary calcium score/coronary CTA
interpretation by the cardiologist is attached.
FINDINGS: 5 mm pulmonary nodule in the right lower lobe (axial image 30 of
series 12), stable compared to prior study from 03/30/2016,
considered benign. Small calcified granulomas also noted in the
visualized right lung. Within the visualized portions of the thorax
there are no other larger more suspicious appearing pulmonary
nodules or masses, there is no acute consolidative airspace disease,
no pleural effusions, no pneumothorax and no lymphadenopathy.
Visualized portions of the upper abdomen are unremarkable. There are
no aggressive appearing lytic or blastic lesions noted in the
visualized portions of the skeleton.
IMPRESSION: 1. No significant incidental noncardiac findings are noted.
